# Patient Record
Sex: Female | Born: 1970 | Race: Black or African American | Hispanic: No | Marital: Single | Smoking: Former smoker
Health system: Southern US, Community
[De-identification: ages and names within clinical notes are randomized; demographics above are authoritative.]

## PROBLEM LIST (undated history)

## (undated) ENCOUNTER — Ambulatory Visit (HOSPITAL_COMMUNITY): Admission: EM | Payer: Self-pay | Source: Home / Self Care

## (undated) DIAGNOSIS — F1210 Cannabis abuse, uncomplicated: Secondary | ICD-10-CM

## (undated) DIAGNOSIS — Z87891 Personal history of nicotine dependence: Secondary | ICD-10-CM

## (undated) HISTORY — PX: FINGER GANGLION CYST EXCISION: SHX1636

## (undated) HISTORY — PX: FINGER SURGERY: SHX640

## (undated) HISTORY — PX: UTERINE FIBROID SURGERY: SHX826

---

## 1999-06-06 ENCOUNTER — Emergency Department (HOSPITAL_COMMUNITY): Admission: EM | Admit: 1999-06-06 | Discharge: 1999-06-06 | Payer: Self-pay | Admitting: Emergency Medicine

## 2003-07-03 ENCOUNTER — Other Ambulatory Visit: Admission: RE | Admit: 2003-07-03 | Discharge: 2003-07-03 | Payer: Self-pay | Admitting: Obstetrics and Gynecology

## 2004-06-24 ENCOUNTER — Inpatient Hospital Stay (HOSPITAL_COMMUNITY): Admission: AD | Admit: 2004-06-24 | Discharge: 2004-06-24 | Payer: Self-pay | Admitting: Obstetrics and Gynecology

## 2004-08-07 ENCOUNTER — Other Ambulatory Visit: Admission: RE | Admit: 2004-08-07 | Discharge: 2004-08-07 | Payer: Self-pay | Admitting: Obstetrics and Gynecology

## 2004-08-17 ENCOUNTER — Ambulatory Visit: Payer: Self-pay | Admitting: Internal Medicine

## 2004-10-15 ENCOUNTER — Ambulatory Visit: Payer: Self-pay | Admitting: Internal Medicine

## 2005-03-30 ENCOUNTER — Inpatient Hospital Stay (HOSPITAL_COMMUNITY): Admission: RE | Admit: 2005-03-30 | Discharge: 2005-04-02 | Payer: Self-pay | Admitting: Obstetrics and Gynecology

## 2006-01-07 ENCOUNTER — Ambulatory Visit: Payer: Self-pay | Admitting: Internal Medicine

## 2006-01-28 ENCOUNTER — Ambulatory Visit: Payer: Self-pay | Admitting: Internal Medicine

## 2006-02-14 ENCOUNTER — Ambulatory Visit: Payer: Self-pay | Admitting: Internal Medicine

## 2006-03-03 ENCOUNTER — Ambulatory Visit (HOSPITAL_COMMUNITY): Admission: RE | Admit: 2006-03-03 | Discharge: 2006-03-03 | Payer: Self-pay | Admitting: Internal Medicine

## 2014-04-28 ENCOUNTER — Emergency Department (HOSPITAL_COMMUNITY)
Admission: EM | Admit: 2014-04-28 | Discharge: 2014-04-29 | Disposition: A | Discharge status: Home / Self Care | Payer: Self-pay | Attending: Emergency Medicine | Admitting: Emergency Medicine

## 2014-04-28 DIAGNOSIS — S4991XA Unspecified injury of right shoulder and upper arm, initial encounter: Secondary | ICD-10-CM | POA: Insufficient documentation

## 2014-04-28 DIAGNOSIS — S3992XA Unspecified injury of lower back, initial encounter: Secondary | ICD-10-CM | POA: Insufficient documentation

## 2014-04-28 DIAGNOSIS — Y92410 Unspecified street and highway as the place of occurrence of the external cause: Secondary | ICD-10-CM | POA: Insufficient documentation

## 2014-04-28 DIAGNOSIS — Y9389 Activity, other specified: Secondary | ICD-10-CM | POA: Insufficient documentation

## 2014-04-28 DIAGNOSIS — M7918 Myalgia, other site: Secondary | ICD-10-CM

## 2014-04-28 DIAGNOSIS — S29091A Other injury of muscle and tendon of front wall of thorax, initial encounter: Secondary | ICD-10-CM | POA: Insufficient documentation

## 2014-04-28 DIAGNOSIS — S199XXA Unspecified injury of neck, initial encounter: Secondary | ICD-10-CM | POA: Insufficient documentation

## 2014-04-29 ENCOUNTER — Emergency Department (HOSPITAL_COMMUNITY): Payer: Self-pay

## 2014-04-29 ENCOUNTER — Encounter (HOSPITAL_COMMUNITY): Payer: Self-pay | Admitting: Emergency Medicine

## 2014-04-29 MED ORDER — HYDROCODONE-ACETAMINOPHEN 5-325 MG PO TABS: 1.0000 | ORAL_TABLET | ORAL | Status: DC | PRN

## 2014-04-29 MED ORDER — METHOCARBAMOL 750 MG PO TABS: 750.0000 mg | ORAL_TABLET | Freq: Four times a day (QID) | ORAL | Status: DC | PRN

## 2014-04-29 MED ORDER — HYDROCODONE-ACETAMINOPHEN 5-325 MG PO TABS
1.0000 | ORAL_TABLET | ORAL | Status: AC
  Administered 2014-04-29: 1 via ORAL
  Filled 2014-04-29: qty 1
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
## 2014-04-29 NOTE — ED Notes (Signed)
Pt's earrings were found in radiology.  Called pt at home and she states, "they are cheap, just throw them away."  Gayle from radiology also spoke with pt via telephone to confirm/witness.
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
## 2016-02-05 ENCOUNTER — Encounter (HOSPITAL_COMMUNITY): Payer: Self-pay | Admitting: *Deleted

## 2016-02-05 ENCOUNTER — Inpatient Hospital Stay (HOSPITAL_COMMUNITY)
Admission: EM | Admit: 2016-02-05 | Discharge: 2016-02-08 | DRG: 580 | Disposition: A | Discharge status: Home / Self Care | Payer: Self-pay | Attending: Family Medicine | Admitting: Family Medicine

## 2016-02-05 DIAGNOSIS — Z87891 Personal history of nicotine dependence: Secondary | ICD-10-CM

## 2016-02-05 DIAGNOSIS — L02511 Cutaneous abscess of right hand: Principal | ICD-10-CM | POA: Diagnosis present

## 2016-02-05 DIAGNOSIS — D509 Iron deficiency anemia, unspecified: Secondary | ICD-10-CM | POA: Diagnosis present

## 2016-02-05 DIAGNOSIS — L02413 Cutaneous abscess of right upper limb: Secondary | ICD-10-CM | POA: Diagnosis present

## 2016-02-05 DIAGNOSIS — R600 Localized edema: Secondary | ICD-10-CM | POA: Diagnosis present

## 2016-02-05 DIAGNOSIS — L237 Allergic contact dermatitis due to plants, except food: Secondary | ICD-10-CM | POA: Diagnosis present

## 2016-02-05 DIAGNOSIS — M7989 Other specified soft tissue disorders: Secondary | ICD-10-CM | POA: Diagnosis present

## 2016-02-05 DIAGNOSIS — F1210 Cannabis abuse, uncomplicated: Secondary | ICD-10-CM | POA: Diagnosis present

## 2016-02-05 HISTORY — DX: Personal history of nicotine dependence: Z87.891

## 2016-02-05 HISTORY — DX: Cannabis abuse, uncomplicated: F12.10

## 2016-02-05 MED ORDER — CEFAZOLIN IN D5W 1 GM/50ML IV SOLN
1.0000 g | Freq: Once | INTRAVENOUS | Status: AC
  Administered 2016-02-05: 1 g via INTRAVENOUS
  Filled 2016-02-05: qty 50

## 2016-02-05 MED ORDER — ACETAMINOPHEN 500 MG PO TABS
1000.0000 mg | ORAL_TABLET | Freq: Once | ORAL | Status: AC
  Administered 2016-02-05: 1000 mg via ORAL
  Filled 2016-02-05: qty 2

## 2016-02-05 MED ORDER — SODIUM CHLORIDE 0.9 % IV BOLUS (SEPSIS)
1000.0000 mL | Freq: Once | INTRAVENOUS | Status: AC
  Administered 2016-02-05: 1000 mL via INTRAVENOUS
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
## 2016-02-06 ENCOUNTER — Observation Stay (HOSPITAL_COMMUNITY): Payer: Self-pay | Admitting: Anesthesiology

## 2016-02-06 ENCOUNTER — Encounter (HOSPITAL_COMMUNITY)
Admission: EM | Disposition: A | Discharge status: Home / Self Care | Payer: Self-pay | Source: Home / Self Care | Attending: Family Medicine

## 2016-02-06 ENCOUNTER — Encounter (HOSPITAL_COMMUNITY): Payer: Self-pay | Admitting: Internal Medicine

## 2016-02-06 ENCOUNTER — Emergency Department (HOSPITAL_COMMUNITY): Payer: Self-pay

## 2016-02-06 DIAGNOSIS — M7989 Other specified soft tissue disorders: Secondary | ICD-10-CM

## 2016-02-06 DIAGNOSIS — R600 Localized edema: Secondary | ICD-10-CM | POA: Diagnosis present

## 2016-02-06 DIAGNOSIS — D509 Iron deficiency anemia, unspecified: Secondary | ICD-10-CM | POA: Diagnosis present

## 2016-02-06 HISTORY — PX: INCISION AND DRAINAGE ABSCESS: SHX5864

## 2016-02-06 LAB — CBC WITH DIFFERENTIAL/PLATELET
Basophils Absolute: 0 10*3/uL (ref 0.0–0.1)
Basophils Relative: 0 %
Eosinophils Absolute: 0.3 10*3/uL (ref 0.0–0.7)
Eosinophils Relative: 3 %
HCT: 27.6 % — ABNORMAL LOW (ref 36.0–46.0)
Hemoglobin: 8.5 g/dL — ABNORMAL LOW (ref 12.0–15.0)
Lymphocytes Relative: 13 %
Lymphs Abs: 1.3 10*3/uL (ref 0.7–4.0)
MCH: 22.5 pg — AB (ref 26.0–34.0)
MCHC: 30.8 g/dL (ref 30.0–36.0)
MCV: 73.2 fL — AB (ref 78.0–100.0)
Monocytes Absolute: 0.5 10*3/uL (ref 0.1–1.0)
Monocytes Relative: 5 %
NEUTROS ABS: 7.9 10*3/uL — AB (ref 1.7–7.7)
Neutrophils Relative %: 79 %
PLATELETS: 394 10*3/uL (ref 150–400)
RBC: 3.77 MIL/uL — ABNORMAL LOW (ref 3.87–5.11)
RDW: 19.4 % — ABNORMAL HIGH (ref 11.5–15.5)
WBC: 10 10*3/uL (ref 4.0–10.5)

## 2016-02-06 LAB — PROTIME-INR
INR: 1.19 (ref 0.00–1.49)
Prothrombin Time: 15.3 seconds — ABNORMAL HIGH (ref 11.6–15.2)

## 2016-02-06 LAB — BASIC METABOLIC PANEL
ANION GAP: 7 (ref 5–15)
BUN: 11 mg/dL (ref 6–20)
CALCIUM: 9.1 mg/dL (ref 8.9–10.3)
CO2: 21 mmol/L — ABNORMAL LOW (ref 22–32)
Chloride: 105 mmol/L (ref 101–111)
Creatinine, Ser: 0.74 mg/dL (ref 0.44–1.00)
GFR calc Af Amer: >60 mL/min (ref >60)
GFR calc non Af Amer: >60 mL/min (ref >60)
Glucose, Bld: 100 mg/dL — ABNORMAL HIGH (ref 65–99)
Potassium: 3.5 mmol/L (ref 3.5–5.1)
Sodium: 133 mmol/L — ABNORMAL LOW (ref 135–145)

## 2016-02-06 LAB — HCG, QUANTITATIVE, PREGNANCY: HCG, BETA CHAIN, QUANT, S: 1 m[IU]/mL (ref <5)

## 2016-02-06 LAB — TYPE AND SCREEN
ABO/RH(D): O POS
ANTIBODY SCREEN: NEGATIVE

## 2016-02-06 LAB — ABO/RH: ABO/RH(D): O POS

## 2016-02-06 LAB — GLUCOSE, CAPILLARY: Glucose-Capillary: 136 mg/dL — ABNORMAL HIGH (ref 65–99)

## 2016-02-06 LAB — RAPID URINE DRUG SCREEN, HOSP PERFORMED
AMPHETAMINES: NOT DETECTED
BARBITURATES: NOT DETECTED
BENZODIAZEPINES: NOT DETECTED
COCAINE: NOT DETECTED
Opiates: POSITIVE — AB
TETRAHYDROCANNABINOL: POSITIVE — AB

## 2016-02-06 LAB — LACTIC ACID, PLASMA: Lactic Acid, Venous: 1.4 mmol/L (ref 0.5–1.9)

## 2016-02-06 LAB — APTT: aPTT: 31 seconds (ref 24–37)

## 2016-02-06 SURGERY — INCISION AND DRAINAGE, ABSCESS
Anesthesia: General | Laterality: Right

## 2016-02-06 MED ORDER — LACTATED RINGERS IV SOLN
INTRAVENOUS | Status: DC | PRN
  Administered 2016-02-06: 07:00:00 via INTRAVENOUS

## 2016-02-06 MED ORDER — PROTAMINE SULFATE 10 MG/ML IV SOLN
INTRAVENOUS | Status: AC
  Filled 2016-02-06: qty 10

## 2016-02-06 MED ORDER — DEXAMETHASONE SODIUM PHOSPHATE 10 MG/ML IJ SOLN
10.0000 mg | Freq: Three times a day (TID) | INTRAMUSCULAR | Status: AC
  Administered 2016-02-06 (×3): 10 mg via INTRAVENOUS
  Filled 2016-02-06 (×2): qty 1

## 2016-02-06 MED ORDER — ONDANSETRON HCL 4 MG/2ML IJ SOLN: 4.0000 mg | Freq: Once | INTRAMUSCULAR | Status: DC | PRN

## 2016-02-06 MED ORDER — OXYCODONE HCL 5 MG PO TABS: 5.0000 mg | ORAL_TABLET | Freq: Once | ORAL | Status: DC | PRN

## 2016-02-06 MED ORDER — HEPARIN SODIUM (PORCINE) 1000 UNIT/ML IJ SOLN
INTRAMUSCULAR | Status: AC
  Filled 2016-02-06: qty 1

## 2016-02-06 MED ORDER — HYDROMORPHONE HCL 1 MG/ML IJ SOLN: 0.2500 mg | INTRAMUSCULAR | Status: DC | PRN

## 2016-02-06 MED ORDER — ENOXAPARIN SODIUM 40 MG/0.4ML ~~LOC~~ SOLN
40.0000 mg | SUBCUTANEOUS | Status: DC
  Administered 2016-02-07 – 2016-02-08 (×2): 40 mg via SUBCUTANEOUS
  Filled 2016-02-06 (×2): qty 0.4

## 2016-02-06 MED ORDER — SODIUM CHLORIDE 0.9 % IR SOLN
Status: DC | PRN
  Administered 2016-02-06: 3000 mL

## 2016-02-06 MED ORDER — LIDOCAINE HCL (CARDIAC) 20 MG/ML IV SOLN
INTRAVENOUS | Status: DC | PRN
  Administered 2016-02-06: 60 mg via INTRAVENOUS

## 2016-02-06 MED ORDER — BUPIVACAINE HCL (PF) 0.25 % IJ SOLN
INTRAMUSCULAR | Status: AC
  Filled 2016-02-06: qty 30

## 2016-02-06 MED ORDER — ACETAMINOPHEN 325 MG PO TABS
650.0000 mg | ORAL_TABLET | Freq: Four times a day (QID) | ORAL | Status: DC | PRN
Start: 2016-02-06 — End: 2016-02-08

## 2016-02-06 MED ORDER — OXYCODONE HCL 5 MG/5ML PO SOLN: 5.0000 mg | Freq: Once | ORAL | Status: DC | PRN

## 2016-02-06 MED ORDER — SUGAMMADEX SODIUM 200 MG/2ML IV SOLN
INTRAVENOUS | Status: AC
  Filled 2016-02-06: qty 2

## 2016-02-06 MED ORDER — ONDANSETRON HCL 4 MG/2ML IJ SOLN
4.0000 mg | Freq: Three times a day (TID) | INTRAMUSCULAR | Status: DC | PRN
  Administered 2016-02-06 (×2): 4 mg via INTRAVENOUS
  Filled 2016-02-06: qty 2

## 2016-02-06 MED ORDER — FENTANYL CITRATE (PF) 100 MCG/2ML IJ SOLN
INTRAMUSCULAR | Status: DC | PRN
  Administered 2016-02-06 (×3): 50 ug via INTRAVENOUS
  Administered 2016-02-06: 100 ug via INTRAVENOUS

## 2016-02-06 MED ORDER — SODIUM CHLORIDE 0.9 % IV SOLN
INTRAVENOUS | Status: DC
  Administered 2016-02-06 (×2): via INTRAVENOUS

## 2016-02-06 MED ORDER — MORPHINE SULFATE (PF) 2 MG/ML IV SOLN: 1.0000 mg | INTRAVENOUS | Status: DC | PRN

## 2016-02-06 MED ORDER — PROPOFOL 10 MG/ML IV BOLUS
INTRAVENOUS | Status: DC | PRN
  Administered 2016-02-06: 170 mg via INTRAVENOUS

## 2016-02-06 MED ORDER — METHOCARBAMOL 750 MG PO TABS: 750.0000 mg | ORAL_TABLET | Freq: Four times a day (QID) | ORAL | Status: DC | PRN

## 2016-02-06 MED ORDER — DIPHENHYDRAMINE HCL 25 MG PO CAPS
25.0000 mg | ORAL_CAPSULE | Freq: Four times a day (QID) | ORAL | Status: DC | PRN
  Administered 2016-02-07 (×2): 25 mg via ORAL
  Filled 2016-02-06 (×2): qty 1

## 2016-02-06 MED ORDER — BUPIVACAINE HCL (PF) 0.25 % IJ SOLN
INTRAMUSCULAR | Status: DC | PRN
  Administered 2016-02-06: 30 mL

## 2016-02-06 MED ORDER — DEXAMETHASONE SODIUM PHOSPHATE 10 MG/ML IJ SOLN
10.0000 mg | Freq: Once | INTRAMUSCULAR | Status: AC
  Administered 2016-02-06: 10 mg via INTRAVENOUS
  Filled 2016-02-06: qty 1

## 2016-02-06 MED ORDER — HYDROCODONE-ACETAMINOPHEN 5-325 MG PO TABS
2.0000 | ORAL_TABLET | ORAL | Status: DC | PRN
  Administered 2016-02-06 – 2016-02-08 (×7): 2 via ORAL
  Filled 2016-02-06 (×7): qty 2

## 2016-02-06 MED ORDER — FENTANYL CITRATE (PF) 250 MCG/5ML IJ SOLN
INTRAMUSCULAR | Status: AC
  Filled 2016-02-06: qty 5

## 2016-02-06 MED ORDER — TEMAZEPAM 15 MG PO CAPS: 15.0000 mg | ORAL_CAPSULE | Freq: Every evening | ORAL | Status: DC | PRN

## 2016-02-06 MED ORDER — MIDAZOLAM HCL 2 MG/2ML IJ SOLN
INTRAMUSCULAR | Status: AC
  Filled 2016-02-06: qty 2

## 2016-02-06 MED ORDER — ROCURONIUM BROMIDE 50 MG/5ML IV SOLN
INTRAVENOUS | Status: AC
  Filled 2016-02-06: qty 1

## 2016-02-06 MED ORDER — ONDANSETRON HCL 4 MG/2ML IJ SOLN
INTRAMUSCULAR | Status: AC
  Filled 2016-02-06: qty 2

## 2016-02-06 MED ORDER — LIDOCAINE 2% (20 MG/ML) 5 ML SYRINGE
INTRAMUSCULAR | Status: AC
  Filled 2016-02-06: qty 5

## 2016-02-06 MED ORDER — VANCOMYCIN HCL IN DEXTROSE 1-5 GM/200ML-% IV SOLN
1000.0000 mg | Freq: Once | INTRAVENOUS | Status: AC
  Administered 2016-02-06: 1000 mg via INTRAVENOUS
  Filled 2016-02-06: qty 200

## 2016-02-06 MED ORDER — LACTATED RINGERS IV SOLN: INTRAVENOUS | Status: DC

## 2016-02-06 MED ORDER — ACETAMINOPHEN 650 MG RE SUPP: 650.0000 mg | Freq: Four times a day (QID) | RECTAL | Status: DC | PRN

## 2016-02-06 MED ORDER — OXYCODONE-ACETAMINOPHEN 5-325 MG PO TABS: 1.0000 | ORAL_TABLET | ORAL | Status: DC | PRN

## 2016-02-06 MED ORDER — MIDAZOLAM HCL 5 MG/5ML IJ SOLN
INTRAMUSCULAR | Status: DC | PRN
  Administered 2016-02-06: 2 mg via INTRAVENOUS

## 2016-02-06 MED ORDER — VITAMIN C 500 MG PO TABS
1000.0000 mg | ORAL_TABLET | Freq: Every day | ORAL | Status: DC
  Administered 2016-02-06 – 2016-02-08 (×3): 1000 mg via ORAL
  Filled 2016-02-06 (×3): qty 2

## 2016-02-06 MED ORDER — DIPHENHYDRAMINE HCL 25 MG PO CAPS
25.0000 mg | ORAL_CAPSULE | Freq: Two times a day (BID) | ORAL | Status: DC
  Administered 2016-02-06 – 2016-02-08 (×6): 25 mg via ORAL
  Filled 2016-02-06 (×7): qty 1

## 2016-02-06 MED ORDER — VANCOMYCIN HCL IN DEXTROSE 1-5 GM/200ML-% IV SOLN
1000.0000 mg | Freq: Two times a day (BID) | INTRAVENOUS | Status: DC
  Administered 2016-02-06 – 2016-02-08 (×4): 1000 mg via INTRAVENOUS
  Filled 2016-02-06 (×6): qty 200

## 2016-02-06 MED ORDER — PROPOFOL 10 MG/ML IV BOLUS
INTRAVENOUS | Status: AC
  Filled 2016-02-06: qty 20

## 2016-02-06 SURGICAL SUPPLY — 62 items
BANDAGE ACE 3X5.8 VEL STRL LF (GAUZE/BANDAGES/DRESSINGS) ×2 IMPLANT
BANDAGE COBAN STERILE 2 (GAUZE/BANDAGES/DRESSINGS) IMPLANT
BANDAGE ELASTIC 3 VELCRO ST LF (GAUZE/BANDAGES/DRESSINGS) ×3 IMPLANT
BANDAGE ELASTIC 4 VELCRO ST LF (GAUZE/BANDAGES/DRESSINGS) ×3 IMPLANT
BNDG CMPR 9X4 STRL LF SNTH (GAUZE/BANDAGES/DRESSINGS) ×1
BNDG COHESIVE 1X5 TAN STRL LF (GAUZE/BANDAGES/DRESSINGS) IMPLANT
BNDG CONFORM 2 STRL LF (GAUZE/BANDAGES/DRESSINGS) IMPLANT
BNDG ESMARK 4X9 LF (GAUZE/BANDAGES/DRESSINGS) ×2 IMPLANT
BNDG GAUZE ELAST 4 BULKY (GAUZE/BANDAGES/DRESSINGS) ×3 IMPLANT
CORDS BIPOLAR (ELECTRODE) ×3 IMPLANT
COVER SURGICAL LIGHT HANDLE (MISCELLANEOUS) ×3 IMPLANT
CUFF TOURNIQUET SINGLE 18IN (TOURNIQUET CUFF) ×2 IMPLANT
DECANTER SPIKE VIAL GLASS SM (MISCELLANEOUS) ×3 IMPLANT
DRAIN PENROSE 1/4X12 LTX STRL (WOUND CARE) IMPLANT
DRSG ADAPTIC 3X8 NADH LF (GAUZE/BANDAGES/DRESSINGS) IMPLANT
DRSG EMULSION OIL 3X3 NADH (GAUZE/BANDAGES/DRESSINGS) ×3 IMPLANT
DRSG PAD ABDOMINAL 8X10 ST (GAUZE/BANDAGES/DRESSINGS) ×6 IMPLANT
FACESHIELD WRAPAROUND (MASK) ×3 IMPLANT
FACESHIELD WRAPAROUND OR TEAM (MASK) IMPLANT
GAUZE IODOFORM PACK 1/2 7832 (GAUZE/BANDAGES/DRESSINGS) ×2 IMPLANT
GAUZE SPONGE 4X4 12PLY STRL (GAUZE/BANDAGES/DRESSINGS) ×3 IMPLANT
GAUZE XEROFORM 1X8 LF (GAUZE/BANDAGES/DRESSINGS) ×3 IMPLANT
GLOVE BIO SURGEON STRL SZ7.5 (GLOVE) ×3 IMPLANT
GLOVE BIOGEL PI IND STRL 6.5 (GLOVE) IMPLANT
GLOVE BIOGEL PI IND STRL 8 (GLOVE) ×1 IMPLANT
GLOVE BIOGEL PI INDICATOR 6.5 (GLOVE) ×2
GLOVE BIOGEL PI INDICATOR 8 (GLOVE) ×2
GLOVE ECLIPSE 6.5 STRL STRAW (GLOVE) ×2 IMPLANT
GOWN STRL REUS W/ TWL LRG LVL3 (GOWN DISPOSABLE) ×1 IMPLANT
GOWN STRL REUS W/TWL LRG LVL3 (GOWN DISPOSABLE) ×3
KIT BASIN OR (CUSTOM PROCEDURE TRAY) ×3 IMPLANT
KIT ROOM TURNOVER OR (KITS) ×3 IMPLANT
LOOP VESSEL MAXI BLUE (MISCELLANEOUS) IMPLANT
MANIFOLD NEPTUNE II (INSTRUMENTS) ×3 IMPLANT
NDL HYPO 25X1 1.5 SAFETY (NEEDLE) IMPLANT
NEEDLE HYPO 25X1 1.5 SAFETY (NEEDLE) ×3 IMPLANT
NS IRRIG 1000ML POUR BTL (IV SOLUTION) ×3 IMPLANT
PACK ORTHO EXTREMITY (CUSTOM PROCEDURE TRAY) ×3 IMPLANT
PAD ABD 8X10 STRL (GAUZE/BANDAGES/DRESSINGS) ×2 IMPLANT
PAD ARMBOARD 7.5X6 YLW CONV (MISCELLANEOUS) ×6 IMPLANT
PAD CAST 4YDX4 CTTN HI CHSV (CAST SUPPLIES) IMPLANT
PADDING CAST COTTON 4X4 STRL (CAST SUPPLIES) ×3
SCRUB BETADINE 4OZ XXX (MISCELLANEOUS) ×3 IMPLANT
SET CYSTO W/LG BORE CLAMP LF (SET/KITS/TRAYS/PACK) ×3 IMPLANT
SOLUTION BETADINE 4OZ (MISCELLANEOUS) ×3 IMPLANT
SPONGE GAUZE 4X4 12PLY STER LF (GAUZE/BANDAGES/DRESSINGS) ×2 IMPLANT
SPONGE LAP 4X18 X RAY DECT (DISPOSABLE) ×3 IMPLANT
SUT ETHILON 4 0 P 3 18 (SUTURE) IMPLANT
SUT ETHILON 4 0 PS 2 18 (SUTURE) ×3 IMPLANT
SUT MON AB 5-0 P3 18 (SUTURE) IMPLANT
SWAB COLLECTION DEVICE MRSA (MISCELLANEOUS) ×2 IMPLANT
SWAB CULTURE ESWAB REG 1ML (MISCELLANEOUS) ×2 IMPLANT
SYR CONTROL 10ML LL (SYRINGE) IMPLANT
TOWEL OR 17X24 6PK STRL BLUE (TOWEL DISPOSABLE) ×3 IMPLANT
TOWEL OR 17X26 10 PK STRL BLUE (TOWEL DISPOSABLE) ×3 IMPLANT
TUBE ANAEROBIC SPECIMEN COL (MISCELLANEOUS) IMPLANT
TUBE CONNECTING 12'X1/4 (SUCTIONS) ×1
TUBE CONNECTING 12X1/4 (SUCTIONS) ×2 IMPLANT
TUBE FEEDING 5FR 15 INCH (TUBING) IMPLANT
UNDERPAD 30X30 INCONTINENT (UNDERPADS AND DIAPERS) ×3 IMPLANT
WATER STERILE IRR 1000ML POUR (IV SOLUTION) ×3 IMPLANT
YANKAUER SUCT BULB TIP NO VENT (SUCTIONS) ×3 IMPLANT

## 2016-02-06 NOTE — ED Notes (Signed)
Patient ambulated independently to the restroom.

## 2016-02-06 NOTE — Op Note (Signed)
[id number] 

## 2016-02-06 NOTE — Anesthesia Procedure Notes (Signed)
Procedure Name: LMA Insertion Date/Time: 02/06/2016 7:48 AM Performed by: Orlinda BlalockMCMILLEN, Teddy Pena L Pre-anesthesia Checklist: Patient identified, Emergency Drugs available, Suction available and Patient being monitored Patient Re-evaluated:Patient Re-evaluated prior to inductionOxygen Delivery Method: Circle System Utilized Preoxygenation: Pre-oxygenation with 100% oxygen Intubation Type: IV induction Ventilation: Mask ventilation without difficulty LMA: LMA inserted LMA Size: 4.0 Number of attempts: 1 Placement Confirmation: positive ETCO2 Tube secured with: Tape Dental Injury: Teeth and Oropharynx as per pre-operative assessment

## 2016-02-06 NOTE — Anesthesia Preprocedure Evaluation (Addendum)
Anesthesia Evaluation  Patient identified by MRN, date of birth, ID band Patient awake    Reviewed: Allergy & Precautions, NPO status , Patient's Chart, lab work & pertinent test results  Airway Mallampati: II  TM Distance: >3 FB Neck ROM: Full    Dental  (+) Teeth Intact, Dental Advisory Given   Pulmonary former smoker,    breath sounds clear to auscultation       Cardiovascular  Rhythm:Regular Rate:Normal     Neuro/Psych    GI/Hepatic   Endo/Other    Renal/GU      Musculoskeletal   Abdominal   Peds  Hematology   Anesthesia Other Findings   Reproductive/Obstetrics                            Anesthesia Physical Anesthesia Plan  ASA: III and emergent  Anesthesia Plan: General   Post-op Pain Management:    Induction: Intravenous  Airway Management Planned: Oral ETT  Additional Equipment:   Intra-op Plan:   Post-operative Plan:   Informed Consent: I have reviewed the patients History and Physical, chart, labs and discussed the procedure including the risks, benefits and alternatives for the proposed anesthesia with the patient or authorized representative who has indicated his/her understanding and acceptance.   Dental advisory given  Plan Discussed with: CRNA and Anesthesiologist  Anesthesia Plan Comments:         Anesthesia Quick Evaluation

## 2016-02-06 NOTE — Progress Notes (Signed)
Pharmacy Antibiotic Note  Hayley Carr is a 45 y.o. female admitted on 02/05/2016 with suspected R hand and arm infection.  Pharmacy has been consulted for Vancomycin dosing. Pt receiving Vancomycin 1gm in ED ~0150 and Ancef 1gm ~2330.  Plan: Vancomycin 1gm IV q12h Will f/u micro data, renal function, and pt's clinical condition Vanc trough prn  Height: 5\' 9"  (175.3 cm) Weight: 165 lb (74.844 kg) IBW/kg (Calculated) : 66.2  Temp (24hrs), Avg:98.2 F (36.8 C), Min:98.1 F (36.7 C), Max:98.3 F (36.8 C)   Recent Labs Lab 02/05/16 2340  WBC 10.0  CREATININE 0.74    Estimated Creatinine Clearance: 92.8 mL/min (by C-G formula based on Cr of 0.74).    Allergies  Allergen Reactions  . Ibuprofen Nausea Only    Antimicrobials this admission: 7/13 Ancef x 1 7/14 Vanc >>   Dose adjustments this admission: n/a  Thank you for allowing pharmacy to be a part of this patient's care.  Christoper Fabianaron Elianah Karis, PharmD, BCPS Clinical pharmacist, pager [phone] 02/06/2016 2:17 AM

## 2016-02-06 NOTE — Brief Op Note (Signed)
02/05/2016 - 02/06/2016  8:26 AM  PATIENT:  Hayley Carr  45 y.o. female  PRE-OPERATIVE DIAGNOSIS:  RIGHT ARM ABSCESS  POST-OPERATIVE DIAGNOSIS:  RIGHT ARM ABSCESS  PROCEDURE:  Procedure(s): INCISION AND DRAINAGE ABSCESS (Right)  SURGEON:  Surgeon(s) and Role:    * Betha LoaKevin Bejamin Hackbart, MD - Primary  PHYSICIAN ASSISTANT:   ASSISTANTS: none   ANESTHESIA:   general  EBL:  Total I/O In: 600 [I.V.:600] Out: -   BLOOD ADMINISTERED:none  DRAINS: iodoform packing  LOCAL MEDICATIONS USED:  MARCAINE     SPECIMEN:  Source of Specimen:  right hand  DISPOSITION OF SPECIMEN:  micro  COUNTS:  YES  TOURNIQUET:   Total Tourniquet Time Documented: Upper Arm (Right) - 24 minutes Total: Upper Arm (Right) - 24 minutes   DICTATION: .Other Dictation: Dictation Number [id number]  PLAN OF CARE: return to floor  PATIENT DISPOSITION:  PACU - hemodynamically stable.   Delay start of Pharmacological VTE agent (>24hrs) due to surgical blood loss or risk of bleeding: no

## 2016-02-06 NOTE — H&P (Signed)
Hayley Carr is an 45 y.o. female.   Chief Complaint: right hand/arm pain and swelling HPI: 45 yo rhd female states she has had progressively worsening right hand and arm pain and swelling over past 2 days.  She thinks it may have started from her helping take care of her boyfriend's poison oak.  She notes also increased warmth in the arm.  No fevers, chills, night sweats.  States she has not injected anything.  Case discussed with Monico Blitz, PA-C and her note from 02/06/2016 reviewed. Xrays viewed and interpreted by me: ap and lateral views of wrist and forearm show no fracture, dislocation, radioopaque foreign body.  Significant dorsal soft tissue swelling without free air. Labs reviewed: WBC 10.0  Allergies:  Allergies  Allergen Reactions  . Ibuprofen Nausea Only    Past Medical History  Diagnosis Date  . Former smoker   . Marijuana abuse     Past Surgical History  Procedure Laterality Date  . Uterine fibroid surgery    . Finger surgery    . Finger ganglion cyst excision      Family History: Family History  Problem Relation Age of Onset  . Colon cancer Father     Social History:   reports that she has quit smoking. She has never used smokeless tobacco. She reports that she uses illicit drugs (Marijuana). She reports that she does not drink alcohol.  Medications: Medications Prior to Admission  Medication Sig Dispense Refill  . HYDROcodone-acetaminophen (NORCO/VICODIN) 5-325 MG per tablet Take 1-2 tablets by mouth every 4 (four) hours as needed for moderate pain or severe pain. (Patient not taking: Reported on 02/06/2016) 20 tablet 0  . methocarbamol (ROBAXIN-750) 750 MG tablet Take 1 tablet (750 mg total) by mouth every 6 (six) hours as needed for muscle spasms. (Patient not taking: Reported on 02/06/2016) 40 tablet 0    Results for orders placed or performed during the hospital encounter of 02/05/16 (from the past 48 hour(s))  CBC with Differential     Status:  Abnormal   Collection Time: 02/05/16 11:40 PM  Result Value Ref Range   WBC 10.0 4.0 - 10.5 K/uL   RBC 3.77 (L) 3.87 - 5.11 MIL/uL   Hemoglobin 8.5 (L) 12.0 - 15.0 g/dL   HCT 27.6 (L) 36.0 - 46.0 %   MCV 73.2 (L) 78.0 - 100.0 fL   MCH 22.5 (L) 26.0 - 34.0 pg   MCHC 30.8 30.0 - 36.0 g/dL   RDW 19.4 (H) 11.5 - 15.5 %   Platelets 394 150 - 400 K/uL   Neutrophils Relative % 79 %   Lymphocytes Relative 13 %   Monocytes Relative 5 %   Eosinophils Relative 3 %   Basophils Relative 0 %   Neutro Abs 7.9 (H) 1.7 - 7.7 K/uL   Lymphs Abs 1.3 0.7 - 4.0 K/uL   Monocytes Absolute 0.5 0.1 - 1.0 K/uL   Eosinophils Absolute 0.3 0.0 - 0.7 K/uL   Basophils Absolute 0.0 0.0 - 0.1 K/uL   Smear Review LARGE PLATELETS PRESENT   Basic metabolic panel     Status: Abnormal   Collection Time: 02/05/16 11:40 PM  Result Value Ref Range   Sodium 133 (L) 135 - 145 mmol/L   Potassium 3.5 3.5 - 5.1 mmol/L   Chloride 105 101 - 111 mmol/L   CO2 21 (L) 22 - 32 mmol/L   Glucose, Bld 100 (H) 65 - 99 mg/dL   BUN 11 6 - 20 mg/dL  Creatinine, Ser 0.74 0.44 - 1.00 mg/dL   Calcium 9.1 8.9 - 10.3 mg/dL   GFR calc non Af Amer >60 >60 mL/min   GFR calc Af Amer >60 >60 mL/min    Comment: (NOTE) The eGFR has been calculated using the CKD EPI equation. This calculation has not been validated in all clinical situations. eGFR's persistently <60 mL/min signify possible Chronic Kidney Disease.    Anion gap 7 5 - 15  Type and screen [place]     Status: None   Collection Time: 02/06/16  2:51 AM  Result Value Ref Range   ABO/RH(D) O POS    Antibody Screen NEG    Sample Expiration 02/09/2016   Lactic acid, plasma     Status: None   Collection Time: 02/06/16  2:51 AM  Result Value Ref Range   Lactic Acid, Venous 1.4 0.5 - 1.9 mmol/L  Protime-INR     Status: Abnormal   Collection Time: 02/06/16  2:51 AM  Result Value Ref Range   Prothrombin Time 15.3 (H) 11.6 - 15.2 seconds   INR 1.19 0.00 - 1.49   APTT     Status: None   Collection Time: 02/06/16  2:51 AM  Result Value Ref Range   aPTT 31 24 - 37 seconds  ABO/Rh     Status: None (Preliminary result)   Collection Time: 02/06/16  2:51 AM  Result Value Ref Range   ABO/RH(D) O POS   hCG, quantitative, pregnancy     Status: None   Collection Time: 02/06/16  2:54 AM  Result Value Ref Range   hCG, Beta Chain, Quant, S 1 <5 mIU/mL    Comment:          GEST. AGE      CONC.  (mIU/mL)   <=1 WEEK        5 - 50     2 WEEKS       50 - 500     3 WEEKS       100 - 10,000     4 WEEKS     1,000 - 30,000     5 WEEKS     3,500 - 115,000   6-8 WEEKS     12,000 - 270,000    12 WEEKS     15,000 - 220,000        FEMALE AND NON-PREGNANT FEMALE:     LESS THAN 5 mIU/mL   Urine rapid drug screen (hosp performed)     Status: Abnormal   Collection Time: 02/06/16  5:06 AM  Result Value Ref Range   Opiates POSITIVE (A) NONE DETECTED   Cocaine NONE DETECTED NONE DETECTED   Benzodiazepines NONE DETECTED NONE DETECTED   Amphetamines NONE DETECTED NONE DETECTED   Tetrahydrocannabinol POSITIVE (A) NONE DETECTED   Barbiturates NONE DETECTED NONE DETECTED    Comment:        DRUG SCREEN FOR MEDICAL PURPOSES ONLY.  IF CONFIRMATION IS NEEDED FOR ANY PURPOSE, NOTIFY LAB WITHIN 5 DAYS.        LOWEST DETECTABLE LIMITS FOR URINE DRUG SCREEN Drug Class       Cutoff (ng/mL) Amphetamine      1000 Barbiturate      200 Benzodiazepine   151 Tricyclics       761 Opiates          300 Cocaine          300 THC  50   Glucose, capillary     Status: Abnormal   Collection Time: 02/06/16  6:28 AM  Result Value Ref Range   Glucose-Capillary 136 (H) 65 - 99 mg/dL    Dg Forearm Right  02/06/2016  CLINICAL DATA:  45 year old female with pain and swelling of the right upper extremity EXAM: RIGHT FOREARM - 2 VIEW; RIGHT HAND - COMPLETE 3+ VIEW COMPARISON:  None. FINDINGS: There is no acute fracture or dislocation. The bones are well mineralized. No  arthritic changes. There is diffuse soft tissue swelling of the dorsum of the hand and forearm with subcutaneous edema. No radiopaque foreign object or soft tissue gas. IMPRESSION: No acute osseous pathology. Diffuse soft tissue swelling of the dorsal aspect of the forearm and hand may represent cellulitis. Clinical correlation is recommended. Electronically Signed   By: Anner Crete M.D.   On: 02/06/2016 01:57   Dg Hand Complete Right  02/06/2016  CLINICAL DATA:  45 year old female with pain and swelling of the right upper extremity EXAM: RIGHT FOREARM - 2 VIEW; RIGHT HAND - COMPLETE 3+ VIEW COMPARISON:  None. FINDINGS: There is no acute fracture or dislocation. The bones are well mineralized. No arthritic changes. There is diffuse soft tissue swelling of the dorsum of the hand and forearm with subcutaneous edema. No radiopaque foreign object or soft tissue gas. IMPRESSION: No acute osseous pathology. Diffuse soft tissue swelling of the dorsal aspect of the forearm and hand may represent cellulitis. Clinical correlation is recommended. Electronically Signed   By: Anner Crete M.D.   On: 02/06/2016 01:57     A comprehensive review of systems was negative. Review of Systems: No fevers, chills, night sweats, chest pain, shortness of breath, nausea, vomiting, diarrhea, constipation, easy bleeding or bruising, headaches, dizziness, vision changes, fainting.   Blood pressure 142/77, pulse 77, temperature 97.8 F (36.6 C), temperature source Oral, resp. rate 17, height 5' 9"  (1.753 m), weight 89.858 kg (198 lb 1.6 oz), last menstrual period 01/29/2016, SpO2 100 %.  General appearance: alert, cooperative and appears stated age Head: Normocephalic, without obvious abnormality, atraumatic Neck: supple, symmetrical, trachea midline Resp: clear to auscultation bilaterally Cardio: regular rate and rhythm GI: non-tender Extremities: Intact sensation and capillary refill all digits.  +epl/fpl/io.   Right forearm and hand swollen and tender to palpation.  Erythema and increased warmth dorsally.  No tenderness or erythema volarly.  Able to move digits and wrist without significant joint pain.  No proximal streaking.  Blisters at dorsoulnar side of wrist.  With seepage.  Pulses: 2+ and symmetric Skin: Skin color, texture, turgor normal. No rashes or lesions Neurologic: Grossly normal Incision/Wound: Blisters as above.  Assessment/Plan Right hand/arm infection vs poison oak reaction or possibly infection over top of poison oak.  Swelling and erythema concerning for infection (such as strep cellulitis).  Recommend incision and drainage in OR.  Patient comfortable with this decision.  Risks, benefits, and alternatives of surgery were discussed and the patient agrees with the plan of care.   Georgio Hattabaugh R 02/06/2016, 7:11 AM

## 2016-02-06 NOTE — ED Notes (Signed)
Attempted report x1. 

## 2016-02-06 NOTE — ED Notes (Signed)
Patient is stable at this time.  A&Ox4 denies any numbness to the right upper extremity.  Will transport to floor after benadryl is given

## 2016-02-06 NOTE — Anesthesia Postprocedure Evaluation (Signed)
Anesthesia Post Note  Patient: Hayley Carr  Procedure(s) Performed: Procedure(s) (LRB): INCISION AND DRAINAGE ABSCESS (Right)  Patient location during evaluation: PACU Anesthesia Type: General Level of consciousness: awake, awake and alert and oriented Pain management: pain level controlled Vital Signs Assessment: post-procedure vital signs reviewed and stable Respiratory status: spontaneous breathing, nonlabored ventilation and respiratory function stable Cardiovascular status: blood pressure returned to baseline Anesthetic complications: no    Last Vitals:  Filed Vitals:   02/06/16 0900 02/06/16 0932  BP: 145/79 181/83  Pulse: 78 78  Temp: 36.7 C 36.6 C  Resp: 13 16    Last Pain:  Filed Vitals:   02/06/16 0945  PainSc: 0-No pain                 Arbie Blankley COKER

## 2016-02-06 NOTE — ED Notes (Signed)
Pt has all belongings with her in the bed.

## 2016-02-06 NOTE — Progress Notes (Addendum)
Subjective: Patient admitted this morning, see detailed H&P by Dr Clyde LundborgNiu. 45 y.o. female with medical history significant of marijuana abuse, formal smoker, who presents with right arm and hand swelling and pain. Pt states that pt states that her boyfriend has poison oak and she has been helping him with treatment. This morning, her right hand and arm started swelling, and becoming red and painful. She also has a few blisters in right hand and forearm. Her pain is constant, 8 out of 10 in severity, nonradiating. Patient does not have fever or chills. No nausea, vomiting. Denies chest pain, shortness of breath, cough, symptoms of UTI or unilateral weakness. Filed Vitals:   02/06/16 0932 02/06/16 1044  BP: 181/83 161/94  Pulse: 78 114  Temp: 97.8 F (36.6 C) 97.9 F (36.6 C)  Resp: 16 18    Chest: Clear Bilaterally Heart : S1S2 RRR Abdomen: Soft, nontender Ext : No edema Neuro: Alert, oriented x 3  A/P Right hand abscess- s/p incision and drainage  Continue vancomycin  Meredeth IdeGagan S Lama [facility] [phone]

## 2016-02-06 NOTE — H&P (Signed)
History and Physical    Hayley Carr WJX:[id number] DOB: 14-Jun-1971 DOA: 02/05/2016  Referring MD/NP/PA:   PCP: No primary care provider on file.   Patient coming from:  The patient is coming from home.  At baseline, pt is independent for most of ADL.       Chief Complaint: right arm and hand swelling and pain.   HPI: Hayley Carr is a 45 y.o. female with medical history significant of marijuana abuse, formal smoker, who presents with right arm and hand swelling and pain.  Pt states that pt states that her boyfriend has poison oak and she has been helping him with treatment. This morning, her right hand and arm started swelling, and becoming red and painful. She also has a few blisters in right hand and forearm. Her pain is constant, 8 out of 10 in severity, nonradiating. Patient does not have fever or chills. No nausea, vomiting. Denies chest pain, shortness of breath, cough, symptoms of UTI or unilateral weakness.  ED Course: pt was found to have WBC 10.0, hemoglobin 8.5 (no previous hemoglobin available), lactic acid 1.4, temperature normal, no tachycardia, electrolytes renal function okay. Patient is placed on MedSurg bed for observation. Hand surgeon, Dr. Merlyn Lot was consulted by EDP.  Review of Systems:   General: no fevers, chills, no changes in body weight, has fatigue HEENT: no blurry vision, hearing changes or sore throat Pulm: no dyspnea, coughing, wheezing CV: no chest pain, no palpitations Abd: no nausea, vomiting, abdominal pain, diarrhea, constipation GU: no dysuria, burning on urination, increased urinary frequency, hematuria  Ext: no leg edema. Has swelling, tenderness, redness and blisters in right hand and forearm.  Neuro: no unilateral weakness, numbness, or tingling, no vision change or hearing loss Skin: no rash MSK: No muscle spasm, no deformity, no limitation of range of movement in spin Heme: No easy bruising.  Travel history: No recent long distant  travel.  Allergy:  Allergies  Allergen Reactions  . Ibuprofen Nausea Only    Past Medical History  Diagnosis Date  . Former smoker   . Marijuana abuse     Past Surgical History  Procedure Laterality Date  . Uterine fibroid surgery    . Finger surgery    . Finger ganglion cyst excision      Social History:  reports that she has quit smoking. She has never used smokeless tobacco. She reports that she uses illicit drugs (Marijuana). She reports that she does not drink alcohol.  Family History:  Family History  Problem Relation Age of Onset  . Colon cancer Father      Prior to Admission medications   Medication Sig Start Date End Date Taking? Authorizing Provider  HYDROcodone-acetaminophen (NORCO/VICODIN) 5-325 MG per tablet Take 1-2 tablets by mouth every 4 (four) hours as needed for moderate pain or severe pain. 04/29/14   Marisa Severin, MD  methocarbamol (ROBAXIN-750) 750 MG tablet Take 1 tablet (750 mg total) by mouth every 6 (six) hours as needed for muscle spasms. 04/29/14   Marisa Severin, MD    Physical Exam: Filed Vitals:   02/06/16 0205 02/06/16 0206 02/06/16 0249 02/06/16 0628  BP:  157/84 179/85 142/77  Pulse:  70 72 77  Temp:  98.3 F (36.8 C) 97.8 F (36.6 C) 97.8 F (36.6 C)  TempSrc:  Oral Oral Oral  Resp:  Height:   (1.753 m)    Weight: 74.844 kg (165 lb) 74.844 kg (165 lb) 89.858 kg (198  lb 1.6 oz)   SpO2:  100% 100% 100%   General: Not in acute distress HEENT:       Eyes: PERRL, EOMI, no scleral icterus.       ENT: No discharge from the ears and nose, no pharynx injection, no tonsillar enlargement.        Neck: No JVD, no bruit, no mass felt. Heme: No neck lymph node enlargement. Cardiac: S1/S2, RRR, No murmurs, No gallops or rubs. Pulm: Good air movement bilaterally. No rales, wheezing, rhonchi or rubs. Abd: Soft, nondistended, nontender, no rebound pain, no organomegaly, BS present. GU: No hematuria Ext: No pitting leg edema  bilaterally. 2+DP/PT pulse bilaterally. Has swelling, tenderness, redness and blisters in right hand and forearm.  Musculoskeletal: No joint deformities, No joint redness or warmth, no limitation of ROM in spin. Skin: No rashes.  Neuro: Alert, oriented X3, cranial nerves II-XII grossly intact, moves all extremities normally.  Psych: Patient is not psychotic, no suicidal or hemocidal ideation.  Labs on Admission: I have personally reviewed following labs and imaging studies  CBC:  Recent Labs Lab 02/05/16 2340  WBC 10.0  NEUTROABS 7.9*  HGB 8.5*  HCT 27.6*  MCV 73.2*  PLT 394   Basic Metabolic Panel:  Recent Labs Lab 02/05/16 2340  NA 133*  K 3.5  CL 105  CO2 21*  GLUCOSE 100*  BUN 11  CREATININE 0.74  CALCIUM 9.1   GFR: Estimated Creatinine Clearance: 106.1 mL/min (by C-G formula based on Cr of 0.74). Liver Function Tests: No results for input(s): AST, ALT, ALKPHOS, BILITOT, PROT, ALBUMIN in the last 168 hours. No results for input(s): LIPASE, AMYLASE in the last 168 hours. No results for input(s): AMMONIA in the last 168 hours. Coagulation Profile:  Recent Labs Lab 02/06/16 0251  INR 1.19   Cardiac Enzymes: No results for input(s): CKTOTAL, CKMB, CKMBINDEX, TROPONINI in the last 168 hours. BNP (last 3 results) No results for input(s): PROBNP in the last 8760 hours. HbA1C: No results for input(s): HGBA1C in the last 72 hours. CBG: No results for input(s): GLUCAP in the last 168 hours. Lipid Profile: No results for input(s): CHOL, HDL, LDLCALC, TRIG, CHOLHDL, LDLDIRECT in the last 72 hours. Thyroid Function Tests: No results for input(s): TSH, T4TOTAL, FREET4, T3FREE, THYROIDAB in the last 72 hours. Anemia Panel: No results for input(s): VITAMINB12, FOLATE, FERRITIN, TIBC, IRON, RETICCTPCT in the last 72 hours. Urine analysis: No results found for: COLORURINE, APPEARANCEUR, LABSPEC, PHURINE, GLUCOSEU, HGBUR, BILIRUBINUR, KETONESUR, PROTEINUR, UROBILINOGEN,  NITRITE, LEUKOCYTESUR Sepsis Labs: @LABRCNTIP (procalcitonin:4,lacticidven:4) )No results found for this or any previous visit (from the past 240 hour(s)).   Radiological Exams on Admission: Dg Forearm Right  02/06/2016  CLINICAL DATA:  45 year old female with pain and swelling of the right upper extremity EXAM: RIGHT FOREARM - 2 VIEW; RIGHT HAND - COMPLETE 3+ VIEW COMPARISON:  None. FINDINGS: There is no acute fracture or dislocation. The bones are well mineralized. No arthritic changes. There is diffuse soft tissue swelling of the dorsum of the hand and forearm with subcutaneous edema. No radiopaque foreign object or soft tissue gas. IMPRESSION: No acute osseous pathology. Diffuse soft tissue swelling of the dorsal aspect of the forearm and hand may represent cellulitis. Clinical correlation is recommended. Electronically Signed   By: Elgie CollardArash  Radparvar M.D.   On: 02/06/2016 01:57   Dg Hand Complete Right  02/06/2016  CLINICAL DATA:  45 year old female with pain and swelling of the right upper extremity EXAM: RIGHT FOREARM - 2 VIEW;  RIGHT HAND - COMPLETE 3+ VIEW COMPARISON:  None. FINDINGS: There is no acute fracture or dislocation. The bones are well mineralized. No arthritic changes. There is diffuse soft tissue swelling of the dorsum of the hand and forearm with subcutaneous edema. No radiopaque foreign object or soft tissue gas. IMPRESSION: No acute osseous pathology. Diffuse soft tissue swelling of the dorsal aspect of the forearm and hand may represent cellulitis. Clinical correlation is recommended. Electronically Signed   By: Elgie Collard M.D.   On: 02/06/2016 01:57     EKG: Not done in ED.  Assessment/Plan Principal Problem:   Swelling of right hand Active Problems:   Microcytic anemia   Swelling of right hand and forearm: Is most likely due to poison oak, but cannot completely rule out infection. Hand surgeon, Dr. Merlyn Lot was consulted, recommended to start patient with vancomycin.  He will see patient in morning. Pt is not septic. Lactate is normal. Hemodynamically stable.  -will place on med-surg bed for obs -start IV vancomycin(recently received 1 dose of Ancef) -Blood culture 2 -When necessary Zofran for nausea, Norco for pain -Decadron 10 mg 3 times a day -Benadryl 25 mg twice a day -f/u Hand surgeon's recommendation.  Microcytic anemia: Patient states that she had fibroid, which was removed 2007. She still has irregular menstrual period, which is likely the etiology. hgb 8.5 with MCV 73.2, indicating iron deficiency. -Check anemia panel  DVT ppx: SCD Code Status: Full code Family Communication: None at bed side.  Disposition Plan:  Anticipate discharge back to previous home environment Consults called:  Hydrographic surveyor, dr. Merlyn Lot Admission status: medical floor/obs  Date of Service 02/06/2016    Lorretta Harp [facility] Pager [phone]  If 7PM-7AM, please contact night-coverage [email or web address] Password [facility] 02/06/2016, 6:37 AM

## 2016-02-06 NOTE — Transfer of Care (Signed)
Immediate Anesthesia Transfer of Care Note  Patient: Hayley Carr D Anger  Procedure(s) Performed: Procedure(s): INCISION AND DRAINAGE ABSCESS (Right)  Patient Location: PACU  Anesthesia Type:General  Level of Consciousness: awake, alert , oriented and patient cooperative  Airway & Oxygen Therapy: Patient Spontanous Breathing  Post-op Assessment: Report given to RN, Post -op Vital signs reviewed and stable and Patient moving all extremities  Post vital signs: Reviewed and stable  Last Vitals:  Filed Vitals:   02/06/16 0249 02/06/16 0628  BP: 179/85 142/77  Pulse: 72 77  Temp: 36.6 C 36.6 C  Resp: 18 17    Last Pain:  Filed Vitals:   02/06/16 0629  PainSc: 3          Complications: No apparent anesthesia complications

## 2016-02-07 DIAGNOSIS — D509 Iron deficiency anemia, unspecified: Secondary | ICD-10-CM

## 2016-02-07 DIAGNOSIS — R609 Edema, unspecified: Secondary | ICD-10-CM

## 2016-02-07 LAB — IRON AND TIBC
Iron: 13 ug/dL — ABNORMAL LOW (ref 28–170)
SATURATION RATIOS: 4 % — AB (ref 10.4–31.8)
TIBC: 360 ug/dL (ref 250–450)
UIBC: 347 ug/dL

## 2016-02-07 LAB — CBC
HCT: 25.3 % — ABNORMAL LOW (ref 36.0–46.0)
HEMOGLOBIN: 7.6 g/dL — AB (ref 12.0–15.0)
MCH: 22.4 pg — AB (ref 26.0–34.0)
MCHC: 30 g/dL (ref 30.0–36.0)
MCV: 74.4 fL — AB (ref 78.0–100.0)
Platelets: 377 10*3/uL (ref 150–400)
RBC: 3.4 MIL/uL — ABNORMAL LOW (ref 3.87–5.11)
RDW: 19.5 % — ABNORMAL HIGH (ref 11.5–15.5)
WBC: 17.6 10*3/uL — ABNORMAL HIGH (ref 4.0–10.5)

## 2016-02-07 LAB — VITAMIN B12: Vitamin B-12: 234 pg/mL (ref 180–914)

## 2016-02-07 LAB — RETICULOCYTES
RBC.: 3.4 MIL/uL — ABNORMAL LOW (ref 3.87–5.11)
RETIC CT PCT: 2.3 % (ref 0.4–3.1)
Retic Count, Absolute: 78.2 10*3/uL (ref 19.0–186.0)

## 2016-02-07 LAB — FERRITIN: FERRITIN: 6 ng/mL — AB (ref 11–307)

## 2016-02-07 LAB — FOLATE: FOLATE: 17.5 ng/mL (ref >5.9)

## 2016-02-07 LAB — GLUCOSE, CAPILLARY: Glucose-Capillary: 128 mg/dL — ABNORMAL HIGH (ref 65–99)

## 2016-02-07 MED ORDER — FERROUS GLUCONATE 324 (38 FE) MG PO TABS
324.0000 mg | ORAL_TABLET | Freq: Three times a day (TID) | ORAL | Status: DC
  Administered 2016-02-07 – 2016-02-08 (×2): 324 mg via ORAL
  Filled 2016-02-07 (×4): qty 1

## 2016-02-07 NOTE — Progress Notes (Signed)
[facility]  PROGRESS NOTE  Obdulia Steier Odell ZHY:[id number] DOB: 04-25-1971 DOA: 02/05/2016 PCP: No primary care provider on file.    Brief HPI:  45 y.o. female with medical history significant of marijuana abuse, formal smoker, who presents with right arm and hand swelling and pain. Pt states that pt states that her boyfriend has poison oak and she has been helping him with treatment. This morning, her right hand and arm started swelling, and becoming red and painful. She also has a few blisters in right hand and forearm.   Principal Problem:   Swelling of right hand Active Problems:   Microcytic anemia   Hand edema   Assessment/Plan: 1. Right arm abscess- status post incision and drainage, continue IV vancomycin. Orthopedics following. Morphine when necessary for pain control. 2. Iron deficiency anemia- patient has severely low levels of iron 13 with saturation 4%. We'll start ferrous gluconate 324 mg tablet 3 times a day. Will transfuse for hemoglobin less than 7.   DVT prophylaxis: Lovenox Code Status: Full code Family Communication: No family member at bedside  Disposition Plan: Likely home in 1-2 days   Consultants:  Orthopedic surgeon  Procedures:  Left hand incision and drainage  Antibiotics:  Vancomycin  Subjective: Patient seen and examined, pain well-controlled with morphine when necessary  Objective: Filed Vitals:   02/06/16 1044 02/06/16 1300 02/06/16 2120 02/07/16 0445  BP: 161/94 156/85 150/78 143/73  Pulse: 76 88 90 78  Temp: 97.9 F (36.6 C) 98.2 F (36.8 C) 97.7 F (36.5 C) 97.7 F (36.5 C)  TempSrc:   Oral Oral  Resp: Height:      Weight:      SpO2: 100% 100% 100% 99%    Intake/Output Summary (Last 24 hours) at 02/07/16 1219 Last data filed at 02/07/16 2952  Gross per 24 hour  Intake   1780 ml  Output      0 ml  Net   1780 ml   Filed Weights   02/06/16 0205 02/06/16 0206 02/06/16 0249  Weight: 74.844 kg (165 lb)  74.844 kg (165 lb) 89.858 kg (198 lb 1.6 oz)    Examination:  General exam: Appears calm and comfortable  Respiratory system: Clear to auscultation. Respiratory effort normal. Cardiovascular system: S1 & S2 heard, RRR. No JVD, murmurs, rubs, gallops or clicks. No pedal edema. Gastrointestinal system: Abdomen is nondistended, soft and nontender. No organomegaly or masses felt. Normal bowel sounds heard. Central nervous system: Alert and oriented. No focal neurological deficits. Extremities: Right arm in dressing Skin: No rashes, lesions or ulcers Psychiatry: Judgement and insight appear normal. Mood & affect appropriate.    Data Reviewed: I have personally reviewed following labs and imaging studies Basic Metabolic Panel:  Recent Labs Lab 02/05/16 2340  NA 133*  K 3.5  CL 105  CO2 21*  GLUCOSE 100*  BUN 11  CREATININE 0.74  CALCIUM 9.1   Liver Function Tests: No results for input(s): AST, ALT, ALKPHOS, BILITOT, PROT, ALBUMIN in the last 168 hours. No results for input(s): LIPASE, AMYLASE in the last 168 hours. No results for input(s): AMMONIA in the last 168 hours. CBC:  Recent Labs Lab 02/05/16 2340 02/07/16 0539  WBC 10.0 17.6*  NEUTROABS 7.9*  --   HGB 8.5* 7.6*  HCT 27.6* 25.3*  MCV 73.2* 74.4*  PLT 394 377    CBG:  Recent Labs Lab 02/06/16 0628 02/07/16 0621  GLUCAP 136* 128*    Recent Results (from the  past 240 hour(s))  Aerobic Culture (superficial specimen)     Status: None (Preliminary result)   Collection Time: 02/06/16  8:05 AM  Result Value Ref Range Status   Specimen Description WOUND RIGHT ARM ABSCESS  Final   Special Requests PATIENT ON FOLLOWING  VANCOMYCIN  Final   Gram Stain   Final    FEW WBC PRESENT,BOTH PMN AND MONONUCLEAR NO ORGANISMS SEEN    Culture PENDING  Incomplete   Report Status PENDING  Incomplete     Studies: Dg Forearm Right  02/06/2016  CLINICAL DATA:  45 year old female with pain and swelling of the right upper  extremity EXAM: RIGHT FOREARM - 2 VIEW; RIGHT HAND - COMPLETE 3+ VIEW COMPARISON:  None. FINDINGS: There is no acute fracture or dislocation. The bones are well mineralized. No arthritic changes. There is diffuse soft tissue swelling of the dorsum of the hand and forearm with subcutaneous edema. No radiopaque foreign object or soft tissue gas. IMPRESSION: No acute osseous pathology. Diffuse soft tissue swelling of the dorsal aspect of the forearm and hand may represent cellulitis. Clinical correlation is recommended. Electronically Signed   By: Elgie CollardArash  Radparvar M.D.   On: 02/06/2016 01:57   Dg Hand Complete Right  02/06/2016  CLINICAL DATA:  45 year old female with pain and swelling of the right upper extremity EXAM: RIGHT FOREARM - 2 VIEW; RIGHT HAND - COMPLETE 3+ VIEW COMPARISON:  None. FINDINGS: There is no acute fracture or dislocation. The bones are well mineralized. No arthritic changes. There is diffuse soft tissue swelling of the dorsum of the hand and forearm with subcutaneous edema. No radiopaque foreign object or soft tissue gas. IMPRESSION: No acute osseous pathology. Diffuse soft tissue swelling of the dorsal aspect of the forearm and hand may represent cellulitis. Clinical correlation is recommended. Electronically Signed   By: Elgie CollardArash  Radparvar M.D.   On: 02/06/2016 01:57    Scheduled Meds: . diphenhydrAMINE  25 mg Oral BID  . enoxaparin (LOVENOX) injection  40 mg Subcutaneous Q24H  . vancomycin  1,000 mg Intravenous Q12H  . vitamin C  1,000 mg Oral Daily   Continuous Infusions: . sodium chloride 100 mL/hr at 02/06/16 1046  . lactated ringers         Time spent: 25 min    [facility],Armenta Erskin S  [facility] Pager [phone]. If 7PM-7AM, please contact night-coverage at [email or web address], Office  [phone]  password [email or web address] 02/07/2016, 12:19 PM  LOS: 1 day

## 2016-02-07 NOTE — Op Note (Signed)
NAME:  [name], [name]                ACCOUNT NO.:  [id number]  MEDICAL RECORD NO.:  [id number]  LOCATION:  5N24C                        FACILITY:  [facility]  PHYSICIAN:  Betha LoaKevin Rosebud Koenen, MD        DATE OF BIRTH:  June 08, 1971  DATE OF PROCEDURE:  02/06/2016 DATE OF DISCHARGE:                              OPERATIVE REPORT   PREOPERATIVE DIAGNOSIS:  Right hand and forearm abscess.  POSTOPERATIVE DIAGNOSIS:  Right hand and forearm abscess.  PROCEDURE:  Right hand and forearm incision and drainage.  SURGEON:  Betha LoaKevin Rosali Augello, MD.  ASSISTANT:  None.  ANESTHESIA:  General.  IV FLUIDS:  Per anesthesia flow sheet.  ESTIMATED BLOOD LOSS:  Minimal.  COMPLICATIONS:  None.  SPECIMENS:  Cultures to Micro.  TOURNIQUET TIME:  24 minutes.  DISPOSITION:  Stable to PACU.  INDICATIONS:  [name] is a 45 year old female, who states over the past 2 days, she has been having increasing pain, swelling, and erythema of the right hand and arm.  She had been helping her boyfriend with his poison oak.  No fevers, chills, or night sweats.  She was seen in the emergency department.  I was consulted for evaluation of the condition. On examination, she had a swollen erythematous dorsum of the hand and forearm.  She was tender to palpation in this forearm.  There was concern of possible superinfection from her poison oak.  I recommended incision and drainage in the operating room.  Risks, benefits, and alternatives of surgery were discussed including risk of blood loss; infection; damage to nerves, vessels, tendons, ligaments, bone; failure of surgery; need for additional surgery; complications with wound healing; continued pain; continued infection; need for repeat irrigation and debridement.  She voiced understanding of these risks and elected to proceed.  OPERATIVE COURSE:  After being identified preoperatively by myself, the patient and I agreed upon procedure and site of procedure.  Surgical site was  marked.  The risks, benefits, and alternatives of the surgery were reviewed and she wished to proceed.  Surgical consent had been signed.  She was transferred to the operating room and placed on the operating table in supine position with the right upper extremity on an armboard.  General anesthesia was induced by the anesthesiologist.  The right upper extremity was prepped and draped in normal sterile orthopedic fashion.  Surgical pause was performed between surgeons, anesthesia, operating staff; and all were in agreement as to the patient, procedure, and site procedure.  Tourniquet at the proximal aspect of the extremity was inflated to 250 mmHg after exsanguination of the limb with Esmarch bandage.  Incision was made over the dorsum of the hand.  This carried into subcutaneous tissues by spreading technique. Bovie electrocautery was used to obtain hemostasis.  There was significant amount of thin watery fluid.  No gross purulence.  The area underneath the tendons was opened, and there was no purulence underneath.  The subcutaneous tissues were spread both radially and ulnarly.  Again, no pockets of gross purulence were countered.  An incision was made over the distal forearm, where there was additional swelling and erythema.  This carried into subcutaneous tissues again by spreading technique.  Again,  the thin watery fluid was encountered.  No gross purulence.  The wounds were both copiously irrigated with 3000 mL of sterile saline by cysto tubing.  They were then packed with 0.5 inch iodoform gauze.  They were injected with 10 mL of 0.25% plain Marcaine to aid in postoperative analgesia.  They were then dressed with sterile 4x4s and ABD and wrapped with a Kerlix bandage.  A volar splint was placed and wrapped with Kerlix and Ace bandage.  Tourniquet was deflated at 24 minutes.  Fingertips were pink with brisk capillary refill after deflation of tourniquet.  Operative drapes were broken  down.  The patient was awoken from anesthesia safely.  She was transferred back to stretcher and taken to PACU in stable condition.  I will see her back in the office at the beginning of the next week for followup.  She is admitted to the hospitalist currently.  We will start hydrotherapy with packing change in 2-3 days.  When she is afebrile with normal white count and comfortable, she can go home and we can pick up in the office.     Betha Loa, MD     KK/MEDQ  D:  02/06/2016  T:  02/07/2016  Job:  [id number]

## 2016-02-08 LAB — CBC
HEMATOCRIT: 27.8 % — AB (ref 36.0–46.0)
Hemoglobin: 8.3 g/dL — ABNORMAL LOW (ref 12.0–15.0)
MCH: 22.4 pg — AB (ref 26.0–34.0)
MCHC: 29.9 g/dL — ABNORMAL LOW (ref 30.0–36.0)
MCV: 74.9 fL — AB (ref 78.0–100.0)
PLATELETS: 247 10*3/uL (ref 150–400)
RBC: 3.71 MIL/uL — AB (ref 3.87–5.11)
RDW: 19.9 % — ABNORMAL HIGH (ref 11.5–15.5)
WBC: 12.7 10*3/uL — AB (ref 4.0–10.5)

## 2016-02-08 LAB — GLUCOSE, CAPILLARY: Glucose-Capillary: 92 mg/dL (ref 65–99)

## 2016-02-08 MED ORDER — FERROUS GLUCONATE 324 (38 FE) MG PO TABS: 324.0000 mg | ORAL_TABLET | Freq: Three times a day (TID) | ORAL | Status: AC

## 2016-02-08 MED ORDER — DOXYCYCLINE HYCLATE 50 MG PO CAPS: 50.0000 mg | ORAL_CAPSULE | Freq: Two times a day (BID) | ORAL | Status: DC

## 2016-02-08 MED ORDER — OXYCODONE-ACETAMINOPHEN 5-325 MG PO TABS: 1.0000 | ORAL_TABLET | Freq: Four times a day (QID) | ORAL | Status: DC | PRN

## 2016-02-08 NOTE — Progress Notes (Signed)
Physician Discharge Summary  Hayley Carr ZOX:[id number] DOB: 03/27/1971 DOA: 02/05/2016  PCP: No primary care provider on filAida Puffere.  Admit date: 02/05/2016 Discharge date: 02/08/2016  Time spent: 25 minutes  Recommendations for Outpatient Follow-up:  1. Follow up Dr Merlyn LotKuzma  On 02/09/16   Discharge Diagnoses:  Principal Problem:   Swelling of right hand Active Problems:   Microcytic anemia   Hand edema   Discharge Condition: Stable  Diet recommendation: Regular diet  Filed Weights   02/06/16 0205 02/06/16 0206 02/06/16 0249  Weight: 74.844 kg (165 lb) 74.844 kg (165 lb) 89.858 kg (198 lb 1.6 oz)    History of present illness:  45 y.o. female with medical history significant of marijuana abuse, formal smoker, who presents with right arm and hand swelling and pain. Pt states that pt states that her boyfriend has poison oak and she has been helping him with treatment. This morning, her right hand and arm started swelling, and becoming red and painful. She also has a few blisters in right hand and forearm. Her pain is constant, 8 out of 10 in severity, nonradiating. Patient does not have fever or chills. No nausea, vomiting. Denies chest pain, shortness of breath, cough, symptoms of UTI or unilateral weakness.  Hospital Course:  1. Right arm abscess- status post incision and drainage, patient was started on  IV vancomycin. Orthopedics will follow up as outpatient. 2. Iron deficiency anemia- patient has severely low levels of iron 13 with saturation 4%. We'll start ferrous gluconate 324 mg tablet 3 times a day.   Procedures:  S/p incision and drainage  Consultations:  Orthopedics  Discharge Exam: Filed Vitals:   02/07/16 2043 02/08/16 0628  BP: 147/70 133/67  Pulse: 76 65  Temp: 98.3 F (36.8 C) 98.2 F (36.8 C)  Resp: 16 16    General: Appears in no acute distress Cardiovascular: S1S2 RRR Respiratory: Clear bilaterally  Discharge Instructions   Discharge  Instructions    Diet - low sodium heart healthy    Complete by:  As directed      Increase activity slowly    Complete by:  As directed           Current Discharge Medication List    START taking these medications   Details  doxycycline (VIBRAMYCIN) 50 MG capsule Take 1 capsule (50 mg total) by mouth 2 (two) times daily. Qty: 10 capsule, Refills: 0    ferrous gluconate (FERGON) 324 MG tablet Take 1 tablet (324 mg total) by mouth 3 (three) times daily with meals. Qty: 90 tablet, Refills: 3    oxyCODONE-acetaminophen (PERCOCET/ROXICET) 5-325 MG tablet Take 1 tablet by mouth every 6 (six) hours as needed for moderate pain. Qty: 20 tablet, Refills: 0      CONTINUE these medications which have NOT CHANGED   Details  methocarbamol (ROBAXIN-750) 750 MG tablet Take 1 tablet (750 mg total) by mouth every 6 (six) hours as needed for muscle spasms. Qty: 40 tablet, Refills: 0      STOP taking these medications     HYDROcodone-acetaminophen (NORCO/VICODIN) 5-325 MG per tablet        Allergies  Allergen Reactions  . Ibuprofen Nausea Only   Follow-up Information    Follow up with Tami RibasKUZMA,KEVIN R, MD On 02/09/2016.   Specialty:  Orthopedic Surgery   Contact information:   [address] [place] [address] [phone]        The results of significant diagnostics from this hospitalization (including imaging, microbiology, ancillary  and laboratory) are listed below for reference.    Significant Diagnostic Studies: Dg Forearm Right  02/06/2016  CLINICAL DATA:  45 year old female with pain and swelling of the right upper extremity EXAM: RIGHT FOREARM - 2 VIEW; RIGHT HAND - COMPLETE 3+ VIEW COMPARISON:  None. FINDINGS: There is no acute fracture or dislocation. The bones are well mineralized. No arthritic changes. There is diffuse soft tissue swelling of the dorsum of the hand and forearm with subcutaneous edema. No radiopaque foreign object or soft tissue gas. IMPRESSION: No acute  osseous pathology. Diffuse soft tissue swelling of the dorsal aspect of the forearm and hand may represent cellulitis. Clinical correlation is recommended. Electronically Signed   By: Elgie Collard M.D.   On: 02/06/2016 01:57   Dg Hand Complete Right  02/06/2016  CLINICAL DATA:  45 year old female with pain and swelling of the right upper extremity EXAM: RIGHT FOREARM - 2 VIEW; RIGHT HAND - COMPLETE 3+ VIEW COMPARISON:  None. FINDINGS: There is no acute fracture or dislocation. The bones are well mineralized. No arthritic changes. There is diffuse soft tissue swelling of the dorsum of the hand and forearm with subcutaneous edema. No radiopaque foreign object or soft tissue gas. IMPRESSION: No acute osseous pathology. Diffuse soft tissue swelling of the dorsal aspect of the forearm and hand may represent cellulitis. Clinical correlation is recommended. Electronically Signed   By: Elgie Collard M.D.   On: 02/06/2016 01:57    Microbiology: Recent Results (from the past 240 hour(s))  Aerobic Culture (superficial specimen)     Status: None (Preliminary result)   Collection Time: 02/06/16  8:05 AM  Result Value Ref Range Status   Specimen Description WOUND RIGHT ARM ABSCESS  Final   Special Requests PATIENT ON FOLLOWING  VANCOMYCIN  Final   Gram Stain   Final    FEW WBC PRESENT,BOTH PMN AND MONONUCLEAR NO ORGANISMS SEEN    Culture NO GROWTH 1 DAY  Final   Report Status PENDING  Incomplete     Labs: Basic Metabolic Panel:  Recent Labs Lab 02/05/16 2340  NA 133*  K 3.5  CL 105  CO2 21*  GLUCOSE 100*  BUN 11  CREATININE 0.74  CALCIUM 9.1   CBC:  Recent Labs Lab 02/05/16 2340 02/07/16 0539 02/08/16 0246  WBC 10.0 17.6* 12.7*  NEUTROABS 7.9*  --   --   HGB 8.5* 7.6* 8.3*  HCT 27.6* 25.3* 27.8*  MCV 73.2* 74.4* 74.9*  PLT 394 377 247    CBG:  Recent Labs Lab 02/06/16 0628 02/07/16 0621 02/08/16 0627  GLUCAP 136* 128* 92       Signed:  Mauro Kaufmann S MD.   [facility] 02/08/2016, 9:25 AM

## 2016-02-08 NOTE — Care Management Note (Signed)
Case Management Note  Patient Details  Name: Hayley Carr MRN: [id number] Date of Birth: May 19, 1971  Subjective/Objective:                  Microcytic anemia  Hand edema Action/Plan:  Discharge planning Expected Discharge Date:  02/08/16              Expected Discharge Plan:  Home/Self Care  In-House Referral:     Discharge planning Services  CM Consult  Post Acute Care Choice:    Choice offered to:  Patient  DME Arranged:  N/A DME Agency:  NA  HH Arranged:  NA HH Agency:  NA  Status of Service:     If discussed at [place] of Stay Meetings, dates discussed:    Additional Comments: CM met with pt in room and gave pt [place] letter with list of participating pharmacies.  Pt verbalized understanding of all MATCH parameters.  CM also gave pt [facility] pamphlet (also on AVS) and pt verbalized understanding she will go to the open clinic at 08:30 on Thursday morning and ask for: AN APPT FOR A PCP; AN APPT FOR FOLLOW UP MEDICAL CARE; AN APPT WITH A NAVIGATOR TO SECURE INSURANCE. No other CM needs were communicated. Dellie Catholic, RN 02/08/2016, 1:13 PM

## 2016-02-09 ENCOUNTER — Encounter (HOSPITAL_COMMUNITY): Payer: Self-pay | Admitting: Orthopedic Surgery

## 2016-02-10 LAB — AEROBIC CULTURE W GRAM STAIN (SUPERFICIAL SPECIMEN)

## 2016-02-10 LAB — AEROBIC CULTURE  (SUPERFICIAL SPECIMEN): CULTURE: NO GROWTH

## 2016-02-11 LAB — ANAEROBIC CULTURE

## 2016-02-25 NOTE — Discharge Summary (Signed)
Physician Discharge Summary  Charl Wellen Incorvaia ZOX:[id number] DOB: Jul 16, 1971 DOA: 02/05/2016  PCP: No primary care provider on file.  Admit date: 02/05/2016 Discharge date: 02/08/2016  Time spent: 25 minutes  Recommendations for Outpatient Follow-up:  1. Follow up Dr Merlyn Lot  On 02/09/16   Discharge Diagnoses:  Principal Problem:   Swelling of right hand Active Problems:   Microcytic anemia   Hand edema   Discharge Condition: Stable  Diet recommendation: Regular diet       Filed Weights   02/06/16 0205 02/06/16 0206 02/06/16 0249  Weight: 74.844 kg (165 lb) 74.844 kg (165 lb) 89.858 kg (198 lb 1.6 oz)    History of present illness:  45 y.o. female with medical history significant of marijuana abuse, formal smoker, who presents with right arm and hand swelling and pain. Pt states that pt states that her boyfriend has poison oak and she has been helping him with treatment. This morning, her right hand and arm started swelling, and becoming red and painful. She also has a few blisters in right hand and forearm. Her pain is constant, 8 out of 10 in severity, nonradiating. Patient does not have fever or chills. No nausea, vomiting. Denies chest pain, shortness of breath, cough, symptoms of UTI or unilateral weakness.  Hospital Course:  1. Right arm abscess- status post incision and drainage, patient was started on  IV vancomycin. Orthopedics will follow up as outpatient. 2. Iron deficiency anemia- patient has severely low levels of iron 13 with saturation 4%. We'll start ferrous gluconate 324 mg tablet 3 times a day.   Procedures:  S/p incision and drainage  Consultations:  Orthopedics  Discharge Exam:     Filed Vitals:   02/07/16 2043 02/08/16 0628  BP: 147/70 133/67  Pulse: 76 65  Temp: 98.3 F (36.8 C) 98.2 F (36.8 C)  Resp: 16 16    General: Appears in no acute distress Cardiovascular: S1S2 RRR Respiratory: Clear bilaterally  Discharge  Instructions       Discharge Instructions    Diet - low sodium heart healthy    Complete by:  As directed      Increase activity slowly    Complete by:  As directed               Current Discharge Medication List    START taking these medications   Details  doxycycline (VIBRAMYCIN) 50 MG capsule Take 1 capsule (50 mg total) by mouth 2 (two) times daily. Qty: 10 capsule, Refills: 0    ferrous gluconate (FERGON) 324 MG tablet Take 1 tablet (324 mg total) by mouth 3 (three) times daily with meals. Qty: 90 tablet, Refills: 3    oxyCODONE-acetaminophen (PERCOCET/ROXICET) 5-325 MG tablet Take 1 tablet by mouth every 6 (six) hours as needed for moderate pain. Qty: 20 tablet, Refills: 0      CONTINUE these medications which have NOT CHANGED   Details  methocarbamol (ROBAXIN-750) 750 MG tablet Take 1 tablet (750 mg total) by mouth every 6 (six) hours as needed for muscle spasms. Qty: 40 tablet, Refills: 0      STOP taking these medications     HYDROcodone-acetaminophen (NORCO/VICODIN) 5-325 MG per tablet        Allergies  Allergen Reactions  . Ibuprofen Nausea Only      Follow-up Information    Follow up with Tami Ribas, MD On 02/09/2016.   Specialty:  Orthopedic Surgery   Contact information:   [address] [place] [address]  [phone]        The results of significant diagnostics from this hospitalization (including imaging, microbiology, ancillary and laboratory) are listed below for reference.    Significant Diagnostic Studies:  Imaging Results  Dg Forearm Right  02/06/2016  CLINICAL DATA:  45 year old female with pain and swelling of the right upper extremity EXAM: RIGHT FOREARM - 2 VIEW; RIGHT HAND - COMPLETE 3+ VIEW COMPARISON:  None. FINDINGS: There is no acute fracture or dislocation. The bones are well mineralized. No arthritic changes. There is diffuse soft tissue swelling of the dorsum of the hand and  forearm with subcutaneous edema. No radiopaque foreign object or soft tissue gas. IMPRESSION: No acute osseous pathology. Diffuse soft tissue swelling of the dorsal aspect of the forearm and hand may represent cellulitis. Clinical correlation is recommended. Electronically Signed   By: Elgie Collard M.D.   On: 02/06/2016 01:57   Dg Hand Complete Right  02/06/2016  CLINICAL DATA:  45 year old female with pain and swelling of the right upper extremity EXAM: RIGHT FOREARM - 2 VIEW; RIGHT HAND - COMPLETE 3+ VIEW COMPARISON:  None. FINDINGS: There is no acute fracture or dislocation. The bones are well mineralized. No arthritic changes. There is diffuse soft tissue swelling of the dorsum of the hand and forearm with subcutaneous edema. No radiopaque foreign object or soft tissue gas. IMPRESSION: No acute osseous pathology. Diffuse soft tissue swelling of the dorsal aspect of the forearm and hand may represent cellulitis. Clinical correlation is recommended. Electronically Signed   By: Elgie Collard M.D.   On: 02/06/2016 01:57     Microbiology:        Recent Results (from the past 240 hour(s))  Aerobic Culture (superficial specimen)     Status: None (Preliminary result)   Collection Time: 02/06/16  8:05 AM  Result Value Ref Range Status   Specimen Description WOUND RIGHT ARM ABSCESS  Final   Special Requests PATIENT ON FOLLOWING  VANCOMYCIN  Final   Gram Stain   Final    FEW WBC PRESENT,BOTH PMN AND MONONUCLEAR NO ORGANISMS SEEN   Culture NO GROWTH 1 DAY  Final   Report Status PENDING  Incomplete     Labs: Basic Metabolic Panel:  Last Labs    Recent Labs Lab 02/05/16 2340  NA 133*  K 3.5  CL 105  CO2 21*  GLUCOSE 100*  BUN 11  CREATININE 0.74  CALCIUM 9.1     CBC:  Last Labs    Recent Labs Lab 02/05/16 2340 02/07/16 0539 02/08/16 0246  WBC 10.0 17.6* 12.7*  NEUTROABS 7.9*  --   --   HGB 8.5* 7.6* 8.3*  HCT 27.6* 25.3* 27.8*  MCV 73.2* 74.4*  74.9*  PLT 394 377 247      CBG:  Last Labs    Recent Labs Lab 02/06/16 0628 02/07/16 0621 02/08/16 0627  GLUCAP 136* 128* 92         Signed:  Mauro Kaufmann S MD.  [facility] 02/08/2016, 9:25 AM

## 2017-08-05 IMAGING — CR DG HAND COMPLETE 3+V*R*
3 series · 3 of 3 positions shown · non-contrast
Comparison: None.

CLINICAL DATA: 44-year-old female with pain and swelling of the
right upper extremity

EXAM:
RIGHT FOREARM - 2 VIEW; RIGHT HAND - COMPLETE 3+ VIEW

[hand pa]
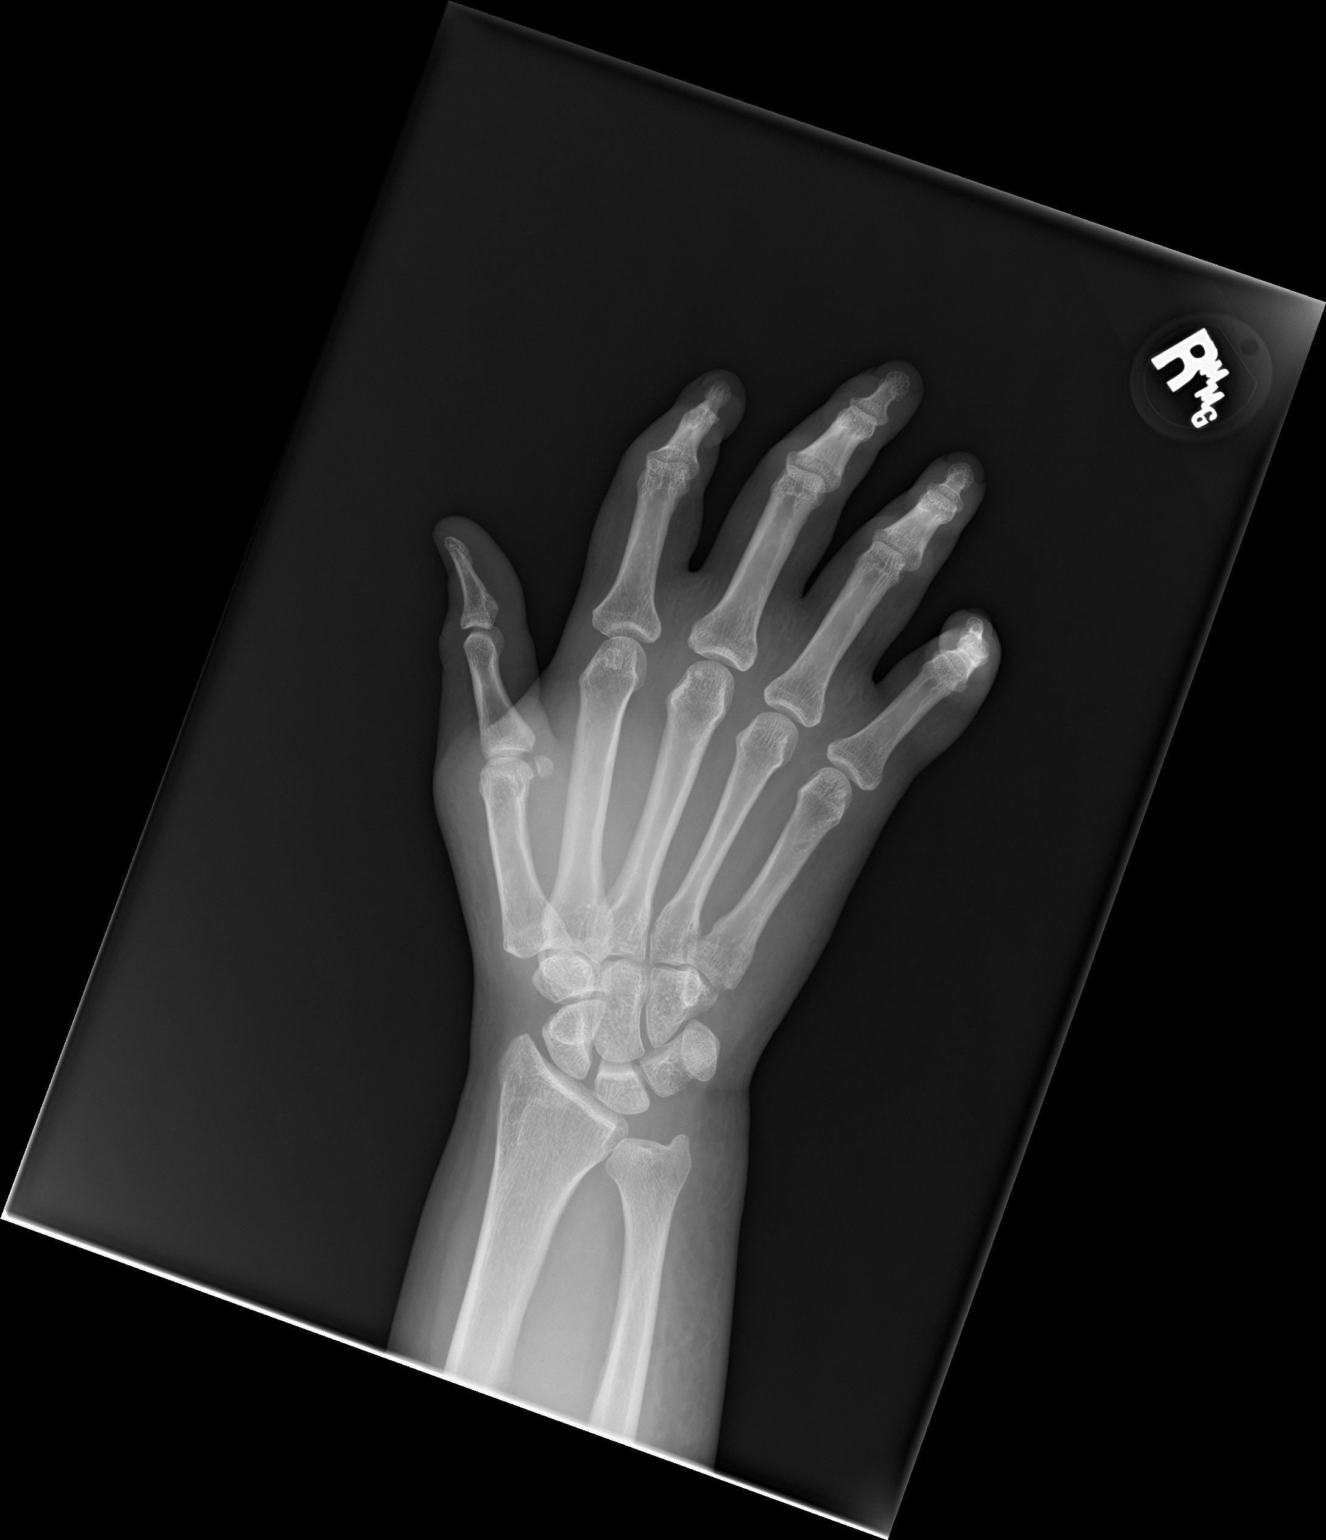

[hand obl]
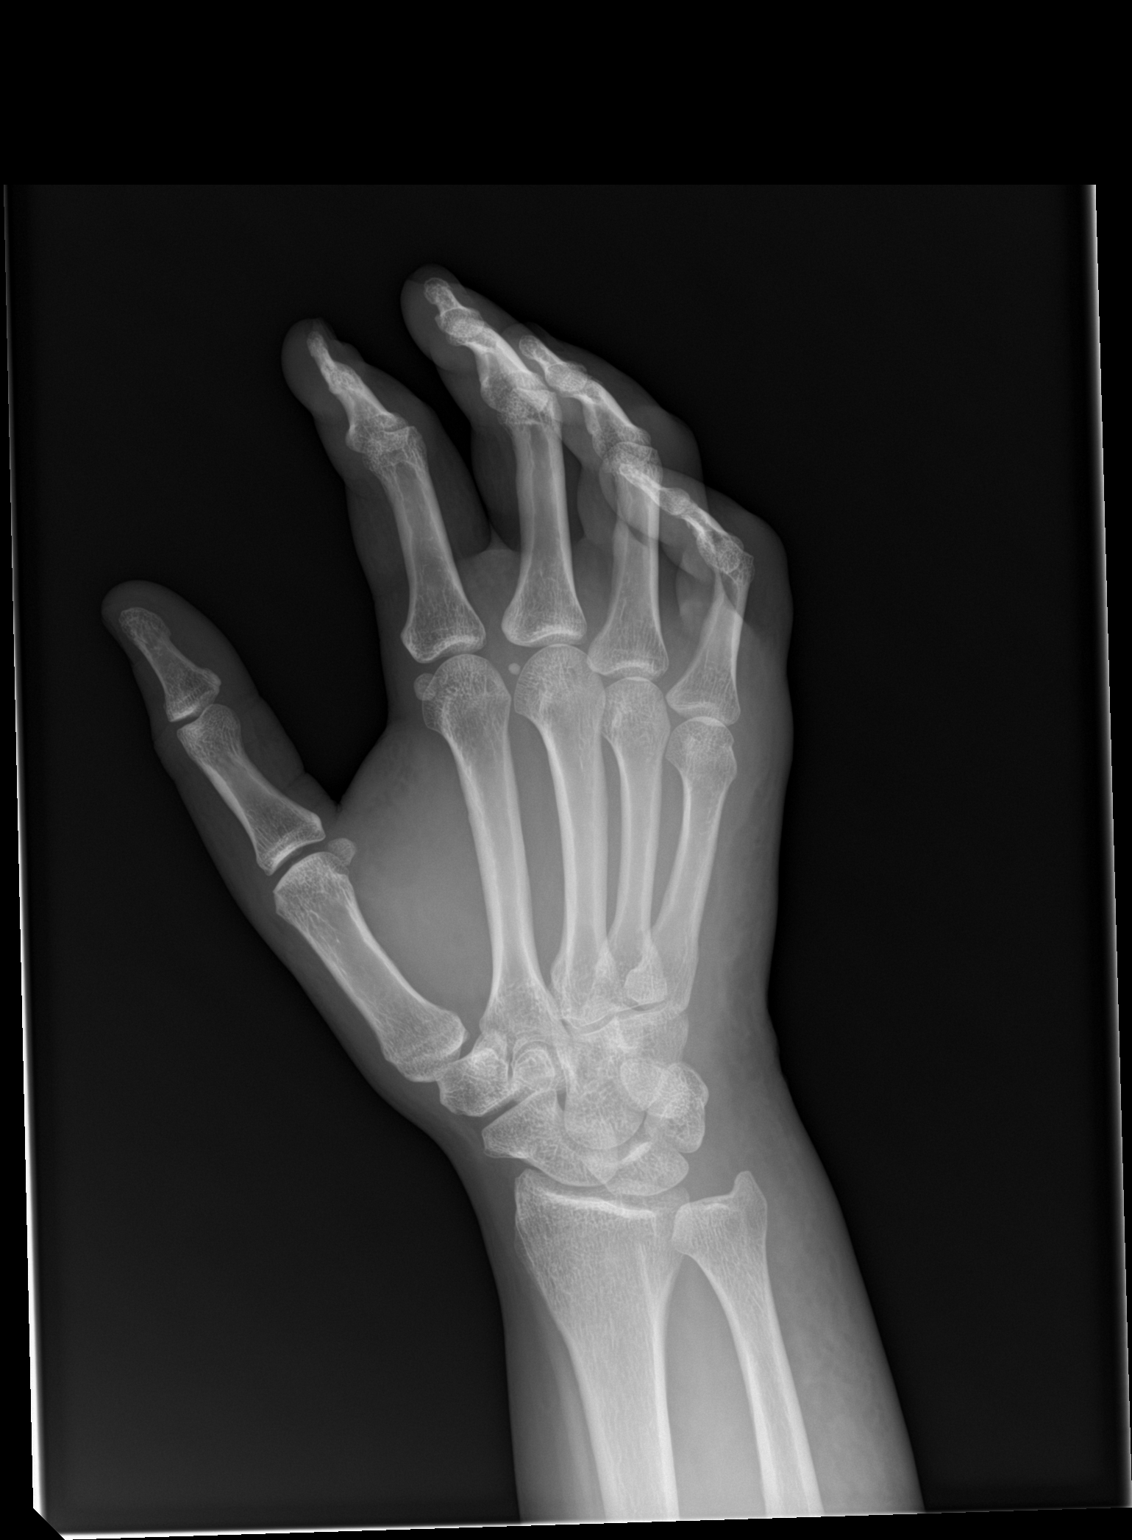

[hand lat]
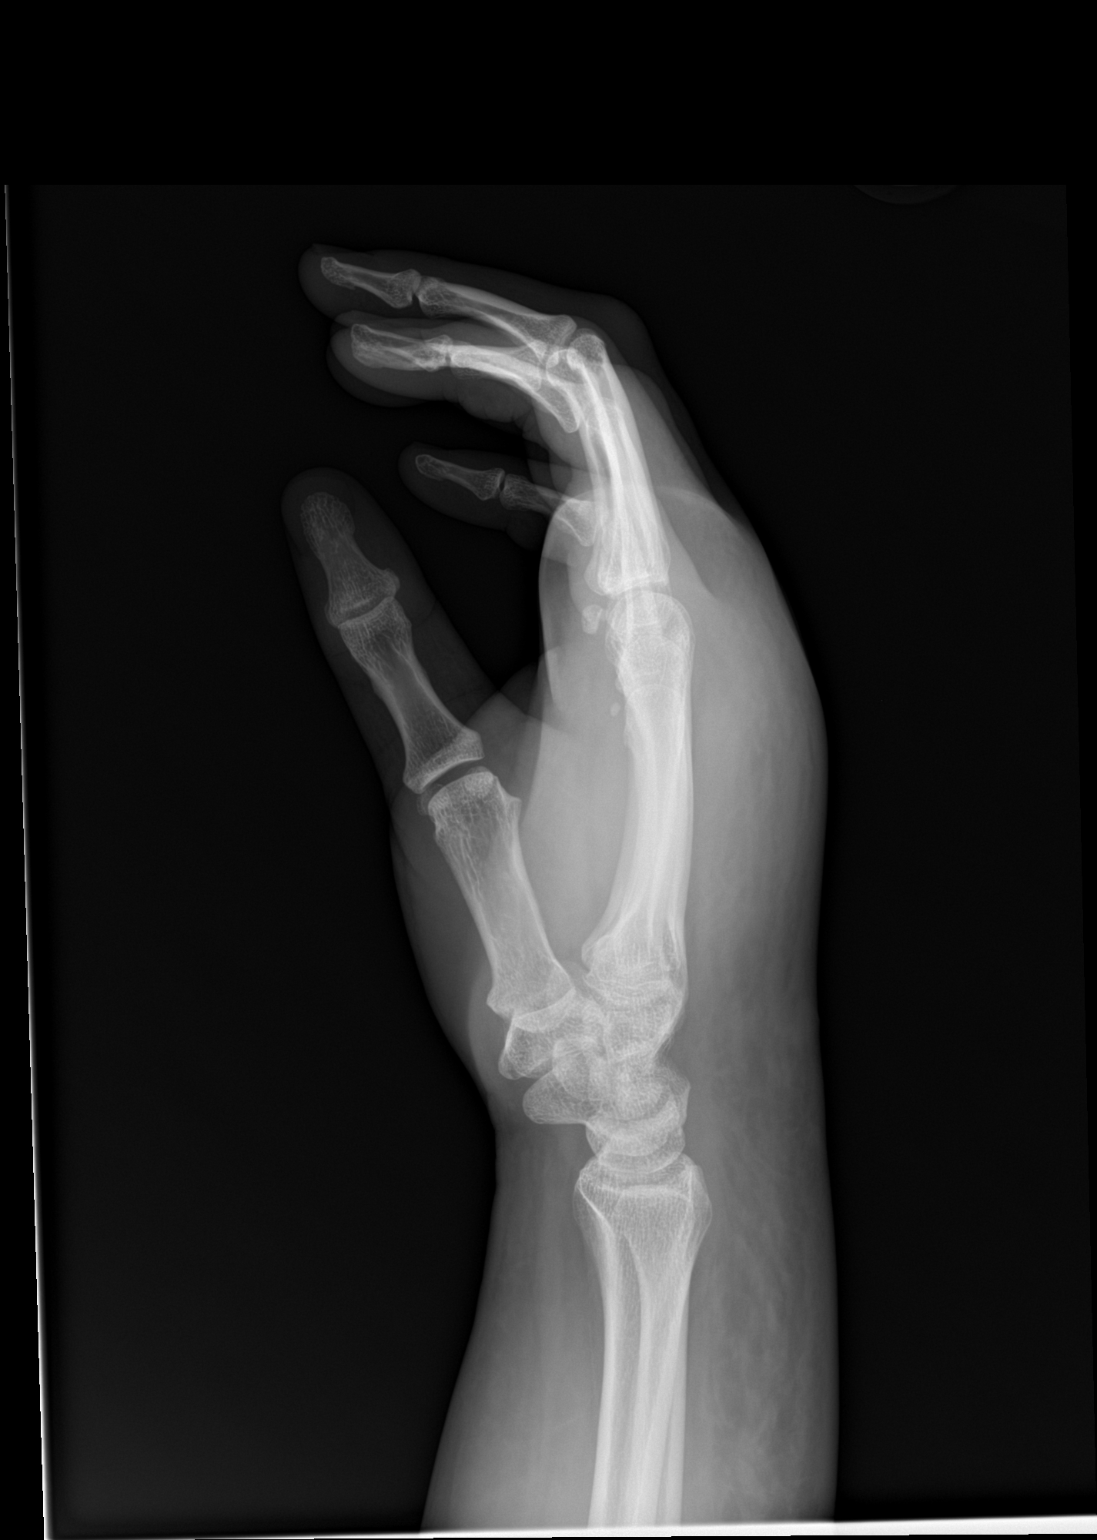

[3 of 3 positions shown; findings below may reference images not displayed]

FINDINGS: There is no acute fracture or dislocation. The bones are well
mineralized. No arthritic changes. There is diffuse soft tissue
swelling of the dorsum of the hand and forearm with subcutaneous
edema. No radiopaque foreign object or soft tissue gas.
IMPRESSION: No acute osseous pathology.

Diffuse soft tissue swelling of the dorsal aspect of the forearm and
hand may represent cellulitis. Clinical correlation is recommended.

## 2017-08-05 IMAGING — CR DG FOREARM 2V*R*
2 series · 2 of 2 positions shown · non-contrast
Comparison: None.

CLINICAL DATA: 44-year-old female with pain and swelling of the
right upper extremity

EXAM:
RIGHT FOREARM - 2 VIEW; RIGHT HAND - COMPLETE 3+ VIEW

[forearm ap]
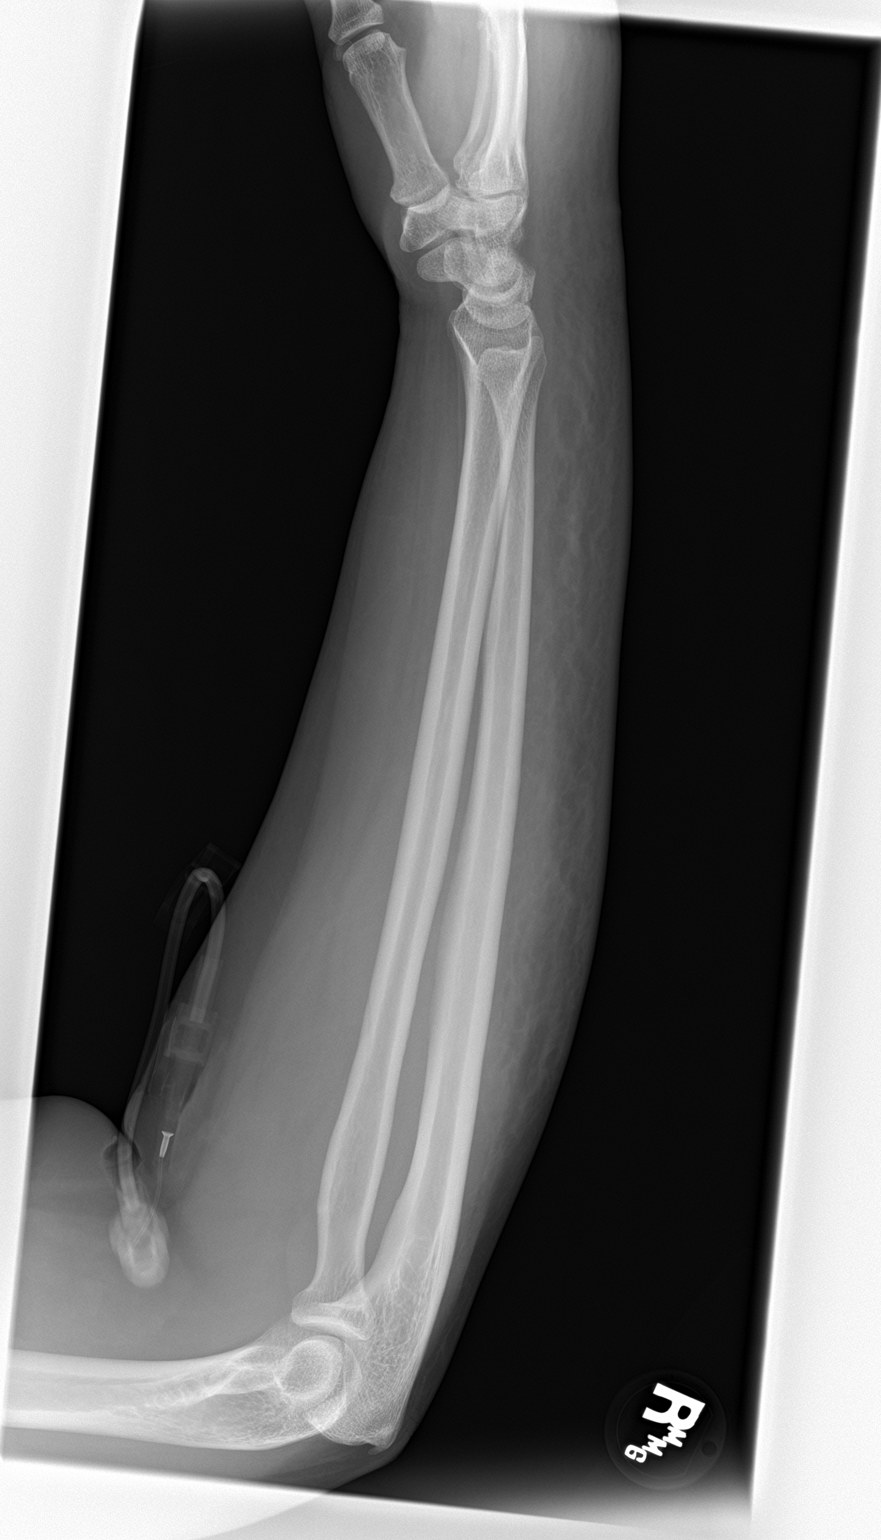

[forearm lat]
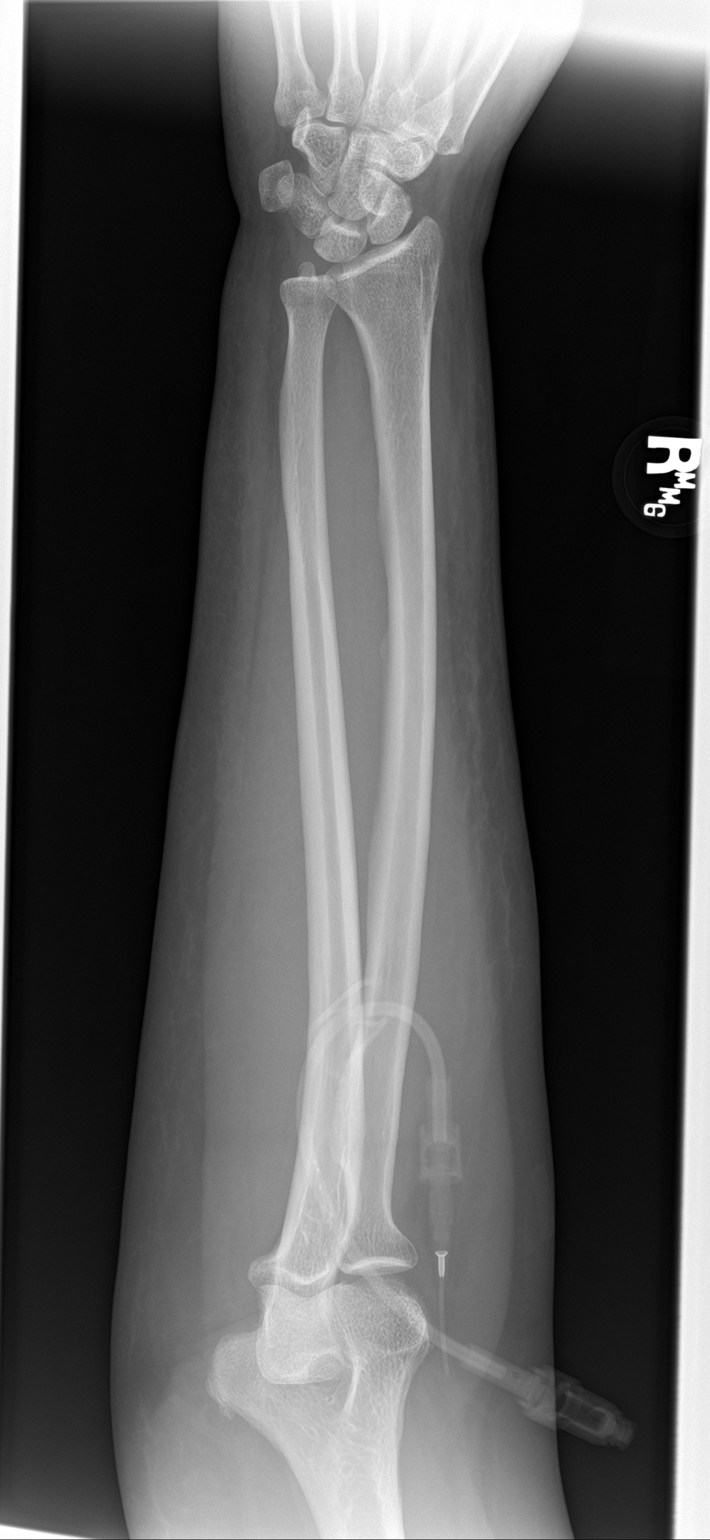

[2 of 2 positions shown; findings below may reference images not displayed]

FINDINGS: There is no acute fracture or dislocation. The bones are well
mineralized. No arthritic changes. There is diffuse soft tissue
swelling of the dorsum of the hand and forearm with subcutaneous
edema. No radiopaque foreign object or soft tissue gas.
IMPRESSION: No acute osseous pathology.

Diffuse soft tissue swelling of the dorsal aspect of the forearm and
hand may represent cellulitis. Clinical correlation is recommended.

## 2019-02-07 ENCOUNTER — Other Ambulatory Visit: Payer: Self-pay

## 2019-02-07 DIAGNOSIS — Z20822 Contact with and (suspected) exposure to covid-19: Secondary | ICD-10-CM

## 2019-02-11 LAB — NOVEL CORONAVIRUS, NAA: SARS-CoV-2, NAA: NOT DETECTED

## 2021-11-11 ENCOUNTER — Emergency Department (HOSPITAL_COMMUNITY)
Admission: EM | Admit: 2021-11-11 | Discharge: 2021-11-11 | Disposition: A | Discharge status: Home / Self Care | Payer: Self-pay | Attending: Emergency Medicine | Admitting: Emergency Medicine

## 2021-11-11 ENCOUNTER — Encounter (HOSPITAL_COMMUNITY): Payer: Self-pay | Admitting: Emergency Medicine

## 2021-11-11 ENCOUNTER — Emergency Department (HOSPITAL_COMMUNITY): Payer: Self-pay

## 2021-11-11 ENCOUNTER — Other Ambulatory Visit: Payer: Self-pay

## 2021-11-11 DIAGNOSIS — S51851A Open bite of right forearm, initial encounter: Secondary | ICD-10-CM | POA: Diagnosis not present

## 2021-11-11 DIAGNOSIS — Z79899 Other long term (current) drug therapy: Secondary | ICD-10-CM | POA: Diagnosis not present

## 2021-11-11 DIAGNOSIS — W503XXA Accidental bite by another person, initial encounter: Secondary | ICD-10-CM

## 2021-11-11 DIAGNOSIS — S59911A Unspecified injury of right forearm, initial encounter: Secondary | ICD-10-CM | POA: Diagnosis present

## 2021-11-11 DIAGNOSIS — Z23 Encounter for immunization: Secondary | ICD-10-CM | POA: Insufficient documentation

## 2021-11-11 DIAGNOSIS — I10 Essential (primary) hypertension: Secondary | ICD-10-CM | POA: Insufficient documentation

## 2021-11-11 MED ORDER — BACITRACIN ZINC 500 UNIT/GM EX OINT: 1.0000 "application " | TOPICAL_OINTMENT | Freq: Two times a day (BID) | CUTANEOUS | 0 refills | Status: AC

## 2021-11-11 MED ORDER — AMOXICILLIN-POT CLAVULANATE 875-125 MG PO TABS
1.0000 | ORAL_TABLET | Freq: Once | ORAL | Status: AC
  Administered 2021-11-11: 1 via ORAL
  Filled 2021-11-11: qty 1

## 2021-11-11 MED ORDER — AMOXICILLIN-POT CLAVULANATE 875-125 MG PO TABS: 1.0000 | ORAL_TABLET | Freq: Two times a day (BID) | ORAL | 0 refills | Status: AC

## 2021-11-11 MED ORDER — TETANUS-DIPHTH-ACELL PERTUSSIS 5-2.5-18.5 LF-MCG/0.5 IM SUSY
0.5000 mL | PREFILLED_SYRINGE | Freq: Once | INTRAMUSCULAR | Status: AC
  Administered 2021-11-11: 0.5 mL via INTRAMUSCULAR
  Filled 2021-11-11: qty 0.5

## 2021-11-11 NOTE — Discharge Instructions (Addendum)
Please take the antibiotics that are prescribed.  Apply ice to the affected area. ? ?Keep the area clean and dry and the raw area covered. Apply bacitracin ointment daily and take the medications provided. ?RETURN TO THE ER IF THERE IS INCREASED PAIN, REDNESS, PUS COMING OUT from the wound site. ? ? ?
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
## 2021-11-11 NOTE — ED Notes (Signed)
Pt wounds dressed and cleaned. Pt given supplies to take home to care for wounds.  ?

## 2023-12-09 ENCOUNTER — Other Ambulatory Visit: Payer: Self-pay | Admitting: Student

## 2023-12-09 ENCOUNTER — Encounter (HOSPITAL_COMMUNITY): Payer: Self-pay

## 2023-12-09 ENCOUNTER — Emergency Department (HOSPITAL_COMMUNITY)
Admission: EM | Admit: 2023-12-09 | Discharge: 2023-12-09 | Disposition: A | Discharge status: Home / Self Care | Payer: Worker's Compensation | Attending: Emergency Medicine | Admitting: Emergency Medicine

## 2023-12-09 ENCOUNTER — Ambulatory Visit
Admission: RE | Admit: 2023-12-09 | Discharge: 2023-12-09 | Disposition: A | Discharge status: Home / Self Care | Payer: Worker's Compensation | Source: Ambulatory Visit | Attending: Student | Admitting: Student

## 2023-12-09 ENCOUNTER — Other Ambulatory Visit: Payer: Self-pay

## 2023-12-09 DIAGNOSIS — X58XXXA Exposure to other specified factors, initial encounter: Secondary | ICD-10-CM | POA: Diagnosis not present

## 2023-12-09 DIAGNOSIS — Y990 Civilian activity done for income or pay: Secondary | ICD-10-CM | POA: Diagnosis not present

## 2023-12-09 DIAGNOSIS — R030 Elevated blood-pressure reading, without diagnosis of hypertension: Secondary | ICD-10-CM | POA: Insufficient documentation

## 2023-12-09 DIAGNOSIS — S82831A Other fracture of upper and lower end of right fibula, initial encounter for closed fracture: Secondary | ICD-10-CM | POA: Diagnosis not present

## 2023-12-09 DIAGNOSIS — S8991XA Unspecified injury of right lower leg, initial encounter: Secondary | ICD-10-CM | POA: Diagnosis present

## 2023-12-09 DIAGNOSIS — R269 Unspecified abnormalities of gait and mobility: Secondary | ICD-10-CM | POA: Insufficient documentation

## 2023-12-09 DIAGNOSIS — S93401A Sprain of unspecified ligament of right ankle, initial encounter: Secondary | ICD-10-CM

## 2023-12-09 MED ORDER — HYDROCODONE-ACETAMINOPHEN 5-325 MG PO TABS
1.0000 | ORAL_TABLET | Freq: Once | ORAL | Status: AC
  Administered 2023-12-09: 1 via ORAL
  Filled 2023-12-09: qty 1

## 2023-12-09 MED ORDER — OXYCODONE-ACETAMINOPHEN 5-325 MG PO TABS: 1.0000 | ORAL_TABLET | Freq: Four times a day (QID) | ORAL | 0 refills | Status: AC | PRN

## 2023-12-09 NOTE — Progress Notes (Signed)
 Orthopedic Tech Progress Note Patient Details:  Kinjal Sonner Eland 02-Dec-1970 [id number]  Ortho Devices Type of Ortho Device: Short leg splint, Stirrup splint Ortho Device/Splint Location: RLE Ortho Device/Splint Interventions: Application   Post Interventions Patient Tolerated: Well Instructions Provided: Care of device  Vashti Bolanos E Eshal Propps 12/09/2023, 4:30 PM

## 2023-12-09 NOTE — ED Provider Notes (Signed)
 [place] EMERGENCY DEPARTMENT AT [facility] Provider Note   CSN: [id number] Arrival date & time: 12/09/23  1527    History  Chief Complaint  Patient presents with   Ankle Pain    Hayley Carr is a 53 y.o. female here for evaluation of right ankle pain. Slipped earlier today on wet grass and inverted her right ankle.  Has pain to the lateral aspect of her right ankle since.  Did not hit her head.  Nontender knee, midshaft tib-fib.  She was seen by an urgent care and had some imaging performed and subsequently sent here after being placed in a walking boot however they did tell her not to put pressure on the leg.  No numbness or weakness.  No overlying skin changes aside from some swelling       HPI    Home Medications Prior to Admission medications   Medication Sig Start Date End Date Taking? Authorizing Provider  oxyCODONE -acetaminophen  (PERCOCET/ROXICET) 5-325 MG tablet Take 1 tablet by mouth every 6 (six) hours as needed for severe pain (pain score 7-10). 12/09/23  Yes Joeline Freer A, PA-C  amoxicillin -clavulanate (AUGMENTIN ) 875-125 MG tablet Take 1 tablet by mouth every 12 (twelve) hours. 11/11/21   Deatra Face, MD  bacitracin  ointment Apply 1 application. topically 2 (two) times daily. 11/11/21   Deatra Face, MD  ferrous gluconate  (FERGON) 324 MG tablet Take 1 tablet (324 mg total) by mouth 3 (three) times daily with meals. 02/08/16   Ozell Blunt, MD      Allergies    Ibuprofen    Review of Systems   Review of Systems  Constitutional: Negative.   HENT: Negative.    Respiratory: Negative.    Cardiovascular: Negative.   Gastrointestinal: Negative.   Genitourinary: Negative.   Musculoskeletal:        Right ankle pain  Neurological: Negative.   All other systems reviewed and are negative.  Physical Exam Updated Vital Signs BP (!) 197/107 (BP Location: Left Arm)   Pulse 90   Temp 98.7 F (37.1 C) (Oral)   Resp 20   SpO2 99%  Physical  Exam Vitals and nursing note reviewed.  Constitutional:      General: She is not in acute distress.    Appearance: She is well-developed. She is not ill-appearing, toxic-appearing or diaphoretic.  HENT:     Head: Atraumatic.  Eyes:     Pupils: Pupils are equal, round, and reactive to light.  Cardiovascular:     Rate and Rhythm: Normal rate.     Pulses: Normal pulses.          Dorsalis pedis pulses are 2+ on the right side and 2+ on the left side.     Heart sounds: Normal heart sounds.  Pulmonary:     Effort: No respiratory distress.  Abdominal:     General: There is no distension.  Musculoskeletal:        General: Normal range of motion.     Cervical back: Normal range of motion.     Comments: Nontender right knee, proximal, midshaft tib-fib.  Tenderness left lateral malleolus.  Nontender foot.  Overlying soft tissue swelling surrounding ankle.  No erythema, warmth  Skin:    General: Skin is warm and dry.     Capillary Refill: Capillary refill takes less than 2 seconds.     Comments: Erythema, warmth, lacerations, contusions or abrasions  Neurological:     General: No focal deficit present.  Mental Status: She is alert.     Cranial Nerves: Cranial nerves 2-12 are intact.     Sensory: Sensation is intact.     Gait: Gait abnormal (due to fx).     Comments: Intact sensation  Psychiatric:        Mood and Affect: Mood normal.    ED Results / Procedures / Treatments   Labs (all labs ordered are listed, but only abnormal results are displayed) Labs Reviewed - No data to display  EKG None  Radiology DG Ankle Complete Right Result Date: 12/09/2023 CLINICAL DATA:  Right ankle sprain. EXAM: RIGHT ANKLE - COMPLETE 3+ VIEW COMPARISON:  None Available. FINDINGS: Displaced oblique fracture of the distal fibula with 5 mm dorsal displacement of the distal fracture fragment. No dislocation. The ankle mortise is intact. The bones are mildly osteopenic. Soft tissue swelling of the  lateral ankle. No radiopaque foreign object or soft tissue gas. IMPRESSION: Displaced oblique fracture of the distal fibula. Electronically Signed   By: Angus Bark M.D.   On: 12/09/2023 14:29    Procedures .Splint Application  Date/Time: 12/09/2023 5:00 PM  Performed by: Dickson Founds, PA-C Authorized by: Dickson Founds, PA-C   Consent:    Consent obtained:  Verbal   Consent given by:  Patient   Risks, benefits, and alternatives were discussed: yes     Risks discussed:  Numbness, pain, swelling and discoloration   Alternatives discussed:  No treatment, delayed treatment, alternative treatment, observation and referral Universal protocol:    Procedure explained and questions answered to patient or proxy's satisfaction: yes     Relevant documents present and verified: yes     Test results available: yes     Imaging studies available: yes     Required blood products, implants, devices, and special equipment available: yes     Site/side marked: yes     Immediately prior to procedure a time out was called: yes     Patient identity confirmed:  Verbally with patient Pre-procedure details:    Distal neurologic exam:  Normal   Distal perfusion: distal pulses strong and brisk capillary refill   Procedure details:    Location:  Ankle   Ankle location:  R ankle   Strapping: no     Cast type:  Short leg   Splint type:  Ankle stirrup and short leg   Supplies:  Elastic bandage, fiberglass and cotton padding   Attestation: Splint applied and adjusted personally by me   Post-procedure details:    Distal neurologic exam:  Normal   Distal perfusion: distal pulses strong and brisk capillary refill     Procedure completion:  Tolerated well, no immediate complications   Post-procedure imaging: not applicable       Medications Ordered in ED Medications  HYDROcodone -acetaminophen  (NORCO/VICODIN) 5-325 MG per tablet 1 tablet (1 tablet Oral Given 12/09/23 1552)    ED Course/  Medical Decision Making/ A&P    Here for evaluation of right ankle pain after inversion injury at work.  No head injury.  Seen earlier at urgent care subsequently sent here for definitive splinting.  She is neurovascularly intact.  Her compartments are soft.  She has no bony tenderness to her foot, chest, proximal tib-fib, knee or femur.  I reviewed imaging from urgent care.  Does show a displaced oblique fracture of the distal fibula.  Plan on splinting.  Patient placed in a splint. NV intact after splint placement.  Will have her follow-up outpatient with  orthopedics.  RICE for symptomatic management.  Of note her blood pressure was elevated here.  She has no evidence of hypertensive urgency or emergency based off of history and exam.  Could certainly be due to pain.  Will have her follow-up with recheck for PCP from this.  The patient has been appropriately medically screened and/or stabilized in the ED. I have low suspicion for any other emergent medical condition which would require further screening, evaluation or treatment in the ED or require inpatient management.  Patient is hemodynamically stable and in no acute distress.  Patient able to ambulate in department prior to ED.  Evaluation does not show acute pathology that would require ongoing or additional emergent interventions while in the emergency department or further inpatient treatment.  I have discussed the diagnosis with the patient and answered all questions.  Pain is been managed while in the emergency department and patient has no further complaints prior to discharge.  Patient is comfortable with plan discussed in room and is stable for discharge at this time.  I have discussed strict return precautions for returning to the emergency department.  Patient was encouraged to follow-up with PCP/specialist refer to at discharge.                                Medical Decision Making Amount and/or Complexity of Data  Reviewed Independent Historian: friend External Data Reviewed: radiology and notes. Radiology: ordered and independent interpretation performed. Decision-making details documented in ED Course.  Risk OTC drugs. Prescription drug management. Decision regarding hospitalization. Diagnosis or treatment significantly limited by social determinants of health.          Final Clinical Impression(s) / ED Diagnoses Final diagnoses:  Other closed fracture of distal end of right fibula, initial encounter  Elevated blood pressure reading    Rx / DC Orders ED Discharge Orders          Ordered    oxyCODONE -acetaminophen  (PERCOCET/ROXICET) 5-325 MG tablet  Every 6 hours PRN        12/09/23 1656              Roslyn Else A, PA-C 12/09/23 1701    Arvilla Birmingham, MD 12/09/23 1735

## 2023-12-09 NOTE — ED Triage Notes (Addendum)
 Pt came in for a right broken ankle that happened this morning. Pt was sent to UC earlier today and had her ankle wrapped and braced. Pt stated she came to the ED for workers comp and pain meds.

## 2023-12-09 NOTE — Discharge Instructions (Addendum)
 It was a pleasure taking care of you here today.    We have placed in a splint.  Tried to keep your leg elevated.  Do not get the splint wet  I put you for a short course of pain medicine.  Take as prescribed.  Please use caution as this medication is an opiate and does have addictive potential.  It may make you sleepy.  Do not drive or operate heavy machinery while taking this medication or make life or death decisions.  Your blood pressure was elevated here today.  Which can be due to pain.  Make sure to follow-up with your primary care provider for recheck   I have placed the number to an orthopedic group in your discharge paperwork.  Please call them on Monday and let them know you were seen in the emergency department for a fracture and need to be seen  Make sure to follow-up, return for any worsening symptoms such as severe pain not controlled by pain medicine, fever, change in color to your toes, numbness or weakness to the extremity

## 2023-12-14 ENCOUNTER — Ambulatory Visit (INDEPENDENT_AMBULATORY_CARE_PROVIDER_SITE_OTHER): Payer: Worker's Compensation | Admitting: Orthopaedic Surgery

## 2023-12-14 DIAGNOSIS — S82891A Other fracture of right lower leg, initial encounter for closed fracture: Secondary | ICD-10-CM | POA: Diagnosis not present

## 2023-12-14 NOTE — Progress Notes (Signed)
 Chief Complaint: Right ankle pain     History of Present Illness:    Hayley Carr is a 53 y.o. female presents today after an ankle injury for which she slipped on May 16.  Since this time she is placed in an ankle splint and made nonweightbearing.  She is here today for the discussion.  She has been using a wheelchair and crutches although this is quite difficult to ambulate.  She remains out of work at this time.  She is otherwise healthy    PMH/PSH/Family History/Social History/Meds/Allergies:    Past Medical History:  Diagnosis Date   Former smoker    Marijuana abuse    Past Surgical History:  Procedure Laterality Date   FINGER GANGLION CYST EXCISION     FINGER SURGERY     INCISION AND DRAINAGE ABSCESS Right 02/06/2016   Procedure: INCISION AND DRAINAGE ABSCESS;  Surgeon: Brunilda Capra, MD;  Location: [facility] OR;  Service: Orthopedics;  Laterality: Right;   UTERINE FIBROID SURGERY     Social History   Socioeconomic History   Marital status: Single    Spouse name: Not on file   Number of children: Not on file   Years of education: Not on file   Highest education level: Not on file  Occupational History   Not on file  Tobacco Use   Smoking status: Former   Smokeless tobacco: Never  Substance and Sexual Activity   Alcohol use: No   Drug use: Yes    Types: Marijuana   Sexual activity: Not on file  Other Topics Concern   Not on file  Social History Narrative   Not on file   Social Drivers of Health   Financial Resource Strain: Not on file  Food Insecurity: Not on file  Transportation Needs: Not on file  Physical Activity: Not on file  Stress: Not on file  Social Connections: Not on file   Family History  Problem Relation Age of Onset   Colon cancer Father    Allergies  Allergen Reactions   Ibuprofen Nausea Only   Current Outpatient Medications  Medication Sig Dispense Refill   amoxicillin -clavulanate (AUGMENTIN ) 875-125 MG tablet Take 1 tablet by  mouth every 12 (twelve) hours. 14 tablet 0   bacitracin  ointment Apply 1 application. topically 2 (two) times daily. 14 g 0   ferrous gluconate  (FERGON) 324 MG tablet Take 1 tablet (324 mg total) by mouth 3 (three) times daily with meals. 90 tablet 3   oxyCODONE -acetaminophen  (PERCOCET/ROXICET) 5-325 MG tablet Take 1 tablet by mouth every 6 (six) hours as needed for severe pain (pain score 7-10). 15 tablet 0   No current facility-administered medications for this visit.   No results found.  Review of Systems:   A ROS was performed including pertinent positives and negatives as documented in the HPI.  Physical Exam :   Constitutional: NAD and appears stated age Neurological: Alert and oriented Psych: Appropriate affect and cooperative There were no vitals taken for this visit.   Comprehensive Musculoskeletal Exam:    Right ankle in a splint which is clean dry and intact.  There is swelling about the ankle.  Sensations intact in all toes   Imaging:   Xray (3 views right ankle): Weber B fibula fracture without evidence of syndesmotic disruption or widening     I personally reviewed and interpreted the radiographs.   Assessment and Plan:   53 y.o. female with a minimally displaced Weber B ankle fibular fracture.  At this time I did discuss that I would recommend getting out of her splint and into a cam boot.  We will plan to have her progress weightbearing.  She will engage with physical therapy.  I will plan to see her back in 4 weeks for reassessment   I personally saw and evaluated the patient, and participated in the management and treatment plan.  Wilhelmenia Harada, MD Attending Physician, Orthopedic Surgery  This document was dictated using Dragon voice recognition software. A reasonable attempt at proof reading has been made to minimize errors.

## 2023-12-14 NOTE — Addendum Note (Signed)
 Addended by: Albesa Huguenin on: 12/14/2023 05:28 PM   Modules accepted: Orders

## 2024-01-11 ENCOUNTER — Ambulatory Visit (HOSPITAL_BASED_OUTPATIENT_CLINIC_OR_DEPARTMENT_OTHER): Payer: Worker's Compensation | Admitting: Orthopaedic Surgery

## 2024-01-11 ENCOUNTER — Ambulatory Visit (HOSPITAL_BASED_OUTPATIENT_CLINIC_OR_DEPARTMENT_OTHER): Payer: Self-pay

## 2024-01-11 DIAGNOSIS — S82891A Other fracture of right lower leg, initial encounter for closed fracture: Secondary | ICD-10-CM

## 2024-01-11 NOTE — Progress Notes (Signed)
 Chief Complaint: Right ankle pain     History of Present Illness:   01/11/2024: Follows up today for her right ankle.  Hayley Carr is a 53 y.o. female presents today after an ankle injury for which she slipped on May 16.  Since this time she is placed in an ankle splint and made nonweightbearing.  She is here today for the discussion.  She has been using a wheelchair and crutches although this is quite difficult to ambulate.  She remains out of work at this time.  She is otherwise healthy    PMH/PSH/Family History/Social History/Meds/Allergies:    Past Medical History:  Diagnosis Date   Former smoker    Marijuana abuse    Past Surgical History:  Procedure Laterality Date   FINGER GANGLION CYST EXCISION     FINGER SURGERY     INCISION AND DRAINAGE ABSCESS Right 02/06/2016   Procedure: INCISION AND DRAINAGE ABSCESS;  Surgeon: Brunilda Capra, MD;  Location: [facility] OR;  Service: Orthopedics;  Laterality: Right;   UTERINE FIBROID SURGERY     Social History   Socioeconomic History   Marital status: Single    Spouse name: Not on file   Number of children: Not on file   Years of education: Not on file   Highest education level: Not on file  Occupational History   Not on file  Tobacco Use   Smoking status: Former   Smokeless tobacco: Never  Substance and Sexual Activity   Alcohol use: No   Drug use: Yes    Types: Marijuana   Sexual activity: Not on file  Other Topics Concern   Not on file  Social History Narrative   Not on file   Social Drivers of Health   Financial Resource Strain: Not on file  Food Insecurity: Not on file  Transportation Needs: Not on file  Physical Activity: Not on file  Stress: Not on file  Social Connections: Not on file   Family History  Problem Relation Age of Onset   Colon cancer Father    Allergies  Allergen Reactions   Ibuprofen Nausea Only   Current Outpatient Medications  Medication Sig Dispense Refill   amoxicillin -clavulanate  (AUGMENTIN ) 875-125 MG tablet Take 1 tablet by mouth every 12 (twelve) hours. 14 tablet 0   bacitracin  ointment Apply 1 application. topically 2 (two) times daily. 14 g 0   ferrous gluconate  (FERGON) 324 MG tablet Take 1 tablet (324 mg total) by mouth 3 (three) times daily with meals. 90 tablet 3   oxyCODONE -acetaminophen  (PERCOCET/ROXICET) 5-325 MG tablet Take 1 tablet by mouth every 6 (six) hours as needed for severe pain (pain score 7-10). 15 tablet 0   No current facility-administered medications for this visit.   No results found.  Review of Systems:   A ROS was performed including pertinent positives and negatives as documented in the HPI.  Physical Exam :   Constitutional: NAD and appears stated age Neurological: Alert and oriented Psych: Appropriate affect and cooperative There were no vitals taken for this visit.   Comprehensive Musculoskeletal Exam:    Right ankle in a splint which is clean dry and intact.  There is swelling about the ankle.  Sensations intact in all toes   Imaging:   Xray (3 views right ankle): Weber B fibula fracture without evidence of syndesmotic disruption or widening     I personally reviewed and interpreted the radiographs.   Assessment and Plan:   53 y.o. female with  a minimally displaced Weber B ankle fibular fracture.  X-rays today show significant healing.  She will wean her boot at this time as well as her crutches.  I will plan to see her back in 6 weeks for reassessment  -Return to clinic in 6 weeks for reassessment   I personally saw and evaluated the patient, and participated in the management and treatment plan.  Wilhelmenia Harada, MD Attending Physician, Orthopedic Surgery  This document was dictated using Dragon voice recognition software. A reasonable attempt at proof reading has been made to minimize errors.

## 2024-01-23 ENCOUNTER — Ambulatory Visit (HOSPITAL_BASED_OUTPATIENT_CLINIC_OR_DEPARTMENT_OTHER): Payer: Self-pay | Admitting: Physical Therapy

## 2024-01-23 ENCOUNTER — Encounter (HOSPITAL_BASED_OUTPATIENT_CLINIC_OR_DEPARTMENT_OTHER): Payer: Self-pay

## 2024-02-22 ENCOUNTER — Ambulatory Visit (HOSPITAL_BASED_OUTPATIENT_CLINIC_OR_DEPARTMENT_OTHER): Payer: Self-pay

## 2024-02-22 ENCOUNTER — Ambulatory Visit (HOSPITAL_BASED_OUTPATIENT_CLINIC_OR_DEPARTMENT_OTHER): Payer: Worker's Compensation | Admitting: Orthopaedic Surgery

## 2024-02-22 DIAGNOSIS — S82891A Other fracture of right lower leg, initial encounter for closed fracture: Secondary | ICD-10-CM

## 2024-02-22 NOTE — Progress Notes (Signed)
 Chief Complaint: Right ankle pain     History of Present Illness:   02/22/2024: Presents today for further discussion of the right ankle.  Overall she is continuing to improve.  She is having some mild swelling at this point.  Hayley Carr is a 54 y.o. female presents today after an ankle injury for which she slipped on May 16.  Since this time she is placed in an ankle splint and made nonweightbearing.  She is here today for the discussion.  She has been using a wheelchair and crutches although this is quite difficult to ambulate.  She remains out of work at this time.  She is otherwise healthy    PMH/PSH/Family History/Social History/Meds/Allergies:    Past Medical History:  Diagnosis Date   Former smoker    Marijuana abuse    Past Surgical History:  Procedure Laterality Date   FINGER GANGLION CYST EXCISION     FINGER SURGERY     INCISION AND DRAINAGE ABSCESS Right 02/06/2016   Procedure: INCISION AND DRAINAGE ABSCESS;  Surgeon: Franky Curia, MD;  Location: [facility] OR;  Service: Orthopedics;  Laterality: Right;   UTERINE FIBROID SURGERY     Social History   Socioeconomic History   Marital status: Single    Spouse name: Not on file   Number of children: Not on file   Years of education: Not on file   Highest education level: Not on file  Occupational History   Not on file  Tobacco Use   Smoking status: Former   Smokeless tobacco: Never  Substance and Sexual Activity   Alcohol use: No   Drug use: Yes    Types: Marijuana   Sexual activity: Not on file  Other Topics Concern   Not on file  Social History Narrative   Not on file   Social Drivers of Health   Financial Resource Strain: Not on file  Food Insecurity: Not on file  Transportation Needs: Not on file  Physical Activity: Not on file  Stress: Not on file  Social Connections: Not on file   Family History  Problem Relation Age of Onset   Colon cancer Father    Allergies  Allergen Reactions   Ibuprofen  Nausea Only   Current Outpatient Medications  Medication Sig Dispense Refill   amoxicillin -clavulanate (AUGMENTIN ) 875-125 MG tablet Take 1 tablet by mouth every 12 (twelve) hours. 14 tablet 0   bacitracin  ointment Apply 1 application. topically 2 (two) times daily. 14 g 0   ferrous gluconate  (FERGON) 324 MG tablet Take 1 tablet (324 mg total) by mouth 3 (three) times daily with meals. 90 tablet 3   oxyCODONE -acetaminophen  (PERCOCET/ROXICET) 5-325 MG tablet Take 1 tablet by mouth every 6 (six) hours as needed for severe pain (pain score 7-10). 15 tablet 0   No current facility-administered medications for this visit.   No results found.  Review of Systems:   A ROS was performed including pertinent positives and negatives as documented in the HPI.  Physical Exam :   Constitutional: NAD and appears stated age Neurological: Alert and oriented Psych: Appropriate affect and cooperative There were no vitals taken for this visit.   Comprehensive Musculoskeletal Exam:    Right ankle in a splint which is clean dry and intact.  There is swelling about the ankle.  Sensations intact in all toes   Imaging:   Xray (3 views right ankle): Weber B fibula fracture without evidence of syndesmotic disruption or widening  I personally reviewed and interpreted the radiographs.   Assessment and Plan:   53 y.o. female with a minimally displaced Weber B ankle fibular fracture.  X-rays today show significant healing.  Overall doing very well.  I will plan to see her back in 3 months for reassessment  -Return to clinic in 12 weeks for reassessment   I personally saw and evaluated the patient, and participated in the management and treatment plan.  Elspeth Parker, MD Attending Physician, Orthopedic Surgery  This document was dictated using Dragon voice recognition software. A reasonable attempt at proof reading has been made to minimize errors.

## 2024-02-24 ENCOUNTER — Telehealth: Payer: Self-pay

## 2024-02-24 NOTE — Telephone Encounter (Signed)
 Wants a return call to discuss her letter that was written today.  [phone]

## 2024-03-05 ENCOUNTER — Other Ambulatory Visit (HOSPITAL_BASED_OUTPATIENT_CLINIC_OR_DEPARTMENT_OTHER): Payer: Self-pay | Admitting: Orthopaedic Surgery

## 2024-03-05 DIAGNOSIS — S82891A Other fracture of right lower leg, initial encounter for closed fracture: Secondary | ICD-10-CM

## 2024-03-29 ENCOUNTER — Ambulatory Visit (INDEPENDENT_AMBULATORY_CARE_PROVIDER_SITE_OTHER): Payer: Worker's Compensation

## 2024-03-29 ENCOUNTER — Ambulatory Visit (INDEPENDENT_AMBULATORY_CARE_PROVIDER_SITE_OTHER): Payer: Worker's Compensation | Admitting: Orthopaedic Surgery

## 2024-03-29 DIAGNOSIS — S82891A Other fracture of right lower leg, initial encounter for closed fracture: Secondary | ICD-10-CM | POA: Diagnosis not present

## 2024-03-29 NOTE — Progress Notes (Signed)
 Chief Complaint: Right ankle pain     History of Present Illness:   03/29/2024: Presents today for further discussion of the right ankle.  She did complete a functional capacity evaluation which did indicate only occasional ability to lift 30 pounds.  Hayley Carr is a 53 y.o. female presents today after an ankle injury for which she slipped on May 16.  Since this time she is placed in an ankle splint and made nonweightbearing.  She is here today for the discussion.  She has been using a wheelchair and crutches although this is quite difficult to ambulate.  She remains out of work at this time.  She is otherwise healthy    PMH/PSH/Family History/Social History/Meds/Allergies:    Past Medical History:  Diagnosis Date   Former smoker    Marijuana abuse    Past Surgical History:  Procedure Laterality Date   FINGER GANGLION CYST EXCISION     FINGER SURGERY     INCISION AND DRAINAGE ABSCESS Right 02/06/2016   Procedure: INCISION AND DRAINAGE ABSCESS;  Surgeon: Franky Curia, MD;  Location: [facility] OR;  Service: Orthopedics;  Laterality: Right;   UTERINE FIBROID SURGERY     Social History   Socioeconomic History   Marital status: Single    Spouse name: Not on file   Number of children: Not on file   Years of education: Not on file   Highest education level: Not on file  Occupational History   Not on file  Tobacco Use   Smoking status: Former   Smokeless tobacco: Never  Substance and Sexual Activity   Alcohol use: No   Drug use: Yes    Types: Marijuana   Sexual activity: Not on file  Other Topics Concern   Not on file  Social History Narrative   Not on file   Social Drivers of Health   Financial Resource Strain: Not on file  Food Insecurity: Not on file  Transportation Needs: Not on file  Physical Activity: Not on file  Stress: Not on file  Social Connections: Not on file   Family History  Problem Relation Age of Onset   Colon cancer Father    Allergies   Allergen Reactions   Ibuprofen Nausea Only   Current Outpatient Medications  Medication Sig Dispense Refill   amoxicillin -clavulanate (AUGMENTIN ) 875-125 MG tablet Take 1 tablet by mouth every 12 (twelve) hours. 14 tablet 0   bacitracin  ointment Apply 1 application. topically 2 (two) times daily. 14 g 0   ferrous gluconate  (FERGON) 324 MG tablet Take 1 tablet (324 mg total) by mouth 3 (three) times daily with meals. 90 tablet 3   oxyCODONE -acetaminophen  (PERCOCET/ROXICET) 5-325 MG tablet Take 1 tablet by mouth every 6 (six) hours as needed for severe pain (pain score 7-10). 15 tablet 0   No current facility-administered medications for this visit.   No results found.  Review of Systems:   A ROS was performed including pertinent positives and negatives as documented in the HPI.  Physical Exam :   Constitutional: NAD and appears stated age Neurological: Alert and oriented Psych: Appropriate affect and cooperative There were no vitals taken for this visit.   Comprehensive Musculoskeletal Exam:    Right ankle in a splint which is clean dry and intact.  There is swelling about the ankle.  Sensations intact in all toes   Imaging:   Xray (3 views right ankle): Weber B fibula fracture without evidence of syndesmotic disruption or widening  I personally reviewed and interpreted the radiographs.   Assessment and Plan:   53 y.o. female with a minimally displaced Weber B ankle fibular fracture.  X-rays today show significant healing.  She is still having persistent joint pain.  Her functional capacity evaluation showed that she is only able to occasionally lift 15 pounds which her daily job duties may be more than this.  As result I do believe that she would require light duty accommodations given the functional capacity evaluation which are not available at her current position.  At this time she is still having pain and as result I would like to obtain an MRI of the right ankle and  see her back to discuss result  - Plan for MRI right ankle and follow-up discuss results   I personally saw and evaluated the patient, and participated in the management and treatment plan.  Elspeth Parker, MD Attending Physician, Orthopedic Surgery  This document was dictated using Dragon voice recognition software. A reasonable attempt at proof reading has been made to minimize errors.

## 2024-04-04 ENCOUNTER — Telehealth: Payer: Self-pay | Admitting: Orthopaedic Surgery

## 2024-04-04 NOTE — Telephone Encounter (Signed)
 Faxed 03/29/24 ov note & work note [employer]/ [employer] [phone]

## 2024-04-06 ENCOUNTER — Telehealth: Payer: Self-pay | Admitting: Orthopaedic Surgery

## 2024-04-06 NOTE — Telephone Encounter (Signed)
 Faxed order for MRI [employer] [phone]

## 2024-04-12 ENCOUNTER — Other Ambulatory Visit: Payer: Self-pay

## 2024-04-17 ENCOUNTER — Ambulatory Visit
Admission: RE | Admit: 2024-04-17 | Discharge: 2024-04-17 | Disposition: A | Discharge status: Home / Self Care | Payer: Self-pay | Source: Ambulatory Visit | Attending: Orthopaedic Surgery | Admitting: Orthopaedic Surgery

## 2024-04-17 DIAGNOSIS — S82891A Other fracture of right lower leg, initial encounter for closed fracture: Secondary | ICD-10-CM

## 2024-05-24 ENCOUNTER — Ambulatory Visit (HOSPITAL_BASED_OUTPATIENT_CLINIC_OR_DEPARTMENT_OTHER): Payer: Self-pay | Admitting: Orthopaedic Surgery

## 2024-05-24 ENCOUNTER — Other Ambulatory Visit (HOSPITAL_BASED_OUTPATIENT_CLINIC_OR_DEPARTMENT_OTHER): Payer: Self-pay

## 2024-05-24 ENCOUNTER — Ambulatory Visit (INDEPENDENT_AMBULATORY_CARE_PROVIDER_SITE_OTHER): Payer: Worker's Compensation | Admitting: Orthopaedic Surgery

## 2024-05-24 DIAGNOSIS — S82891A Other fracture of right lower leg, initial encounter for closed fracture: Secondary | ICD-10-CM

## 2024-05-24 MED ORDER — OXYCODONE HCL 5 MG PO TABS
5.0000 mg | ORAL_TABLET | ORAL | 0 refills | Status: AC | PRN
  Filled 2024-05-24: qty 20, 4d supply, fill #0

## 2024-05-24 MED ORDER — ASPIRIN 325 MG PO TBEC
325.0000 mg | DELAYED_RELEASE_TABLET | Freq: Every day | ORAL | 0 refills | Status: AC
  Filled 2024-05-24: qty 14, 14d supply, fill #0

## 2024-05-24 MED ORDER — ACETAMINOPHEN 500 MG PO TABS
500.0000 mg | ORAL_TABLET | Freq: Three times a day (TID) | ORAL | 0 refills | Status: AC
  Filled 2024-05-24: qty 30, 10d supply, fill #0

## 2024-05-24 NOTE — Progress Notes (Signed)
 Chief Complaint: Right ankle pain     History of Present Illness:   05/24/2024: Presents today for MRI follow-up.  She is still having significant pain about the lateral malleolus.Hayley Carr is a 53 y.o. female presents today after an ankle injury for which she slipped on May 16.  Since this time she is placed in an ankle splint and made nonweightbearing.  She is here today for the discussion.  She has been using a wheelchair and crutches although this is quite difficult to ambulate.  She remains out of work at this time.  She is otherwise healthy    PMH/PSH/Family History/Social History/Meds/Allergies:    Past Medical History:  Diagnosis Date   Former smoker    Marijuana abuse    Past Surgical History:  Procedure Laterality Date   FINGER GANGLION CYST EXCISION     FINGER SURGERY     INCISION AND DRAINAGE ABSCESS Right 02/06/2016   Procedure: INCISION AND DRAINAGE ABSCESS;  Surgeon: Franky Curia, MD;  Location: [facility] OR;  Service: Orthopedics;  Laterality: Right;   UTERINE FIBROID SURGERY     Social History   Socioeconomic History   Marital status: Single    Spouse name: Not on file   Number of children: Not on file   Years of education: Not on file   Highest education level: Not on file  Occupational History   Not on file  Tobacco Use   Smoking status: Former   Smokeless tobacco: Never  Substance and Sexual Activity   Alcohol use: No   Drug use: Yes    Types: Marijuana   Sexual activity: Not on file  Other Topics Concern   Not on file  Social History Narrative   Not on file   Social Drivers of Health   Financial Resource Strain: Not on file  Food Insecurity: Not on file  Transportation Needs: Not on file  Physical Activity: Not on file  Stress: Not on file  Social Connections: Not on file   Family History  Problem Relation Age of Onset   Colon cancer Father    Allergies  Allergen Reactions   Ibuprofen Nausea Only   Current Outpatient  Medications  Medication Sig Dispense Refill   acetaminophen  (TYLENOL ) 500 MG tablet Take 1 tablet (500 mg total) by mouth every 8 (eight) hours for 10 days. 30 tablet 0   aspirin EC 325 MG tablet Take 1 tablet (325 mg total) by mouth daily. 14 tablet 0   oxyCODONE  (ROXICODONE ) 5 MG immediate release tablet Take 1 tablet (5 mg total) by mouth every 4 (four) hours as needed for severe pain (pain score 7-10) or breakthrough pain. 20 tablet 0   amoxicillin -clavulanate (AUGMENTIN ) 875-125 MG tablet Take 1 tablet by mouth every 12 (twelve) hours. 14 tablet 0   bacitracin  ointment Apply 1 application. topically 2 (two) times daily. 14 g 0   ferrous gluconate  (FERGON) 324 MG tablet Take 1 tablet (324 mg total) by mouth 3 (three) times daily with meals. 90 tablet 3   oxyCODONE -acetaminophen  (PERCOCET/ROXICET) 5-325 MG tablet Take 1 tablet by mouth every 6 (six) hours as needed for severe pain (pain score 7-10). 15 tablet 0   No current facility-administered medications for this visit.   No results found.  Review of Systems:   A ROS was performed including pertinent positives and negatives as documented in the HPI.  Physical Exam :   Constitutional: NAD and appears stated age Neurological: Alert and oriented  Psych: Appropriate affect and cooperative There were no vitals taken for this visit.   Comprehensive Musculoskeletal Exam:   Right ankle tenderness about the distal fibula with pain with external rotation of the foot.  There is some swelling about the ankle.  Remainder of distal neurosensory exam is intact.  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   Assessment and Plan:   53 y.o. female with a minimally displaced Weber B ankle fibular fracture.  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  Given this unfortunately I do believe that surgical intervention would be needed to promote healing of the fracture.  I did discuss ultimately that what would recommend an approach to the fibula in order to create a bleeding surface to take down the fibular nonunion as well as fibular nailing to promote stability with distal syndesmotic fixation across the joint.  I did discuss risks and limitations.  I did discuss all the associated recovery timeframe.  After discussion she would like to proceed  - Plan for right ankle fibular nailing with syndesmotic fixation   After a lengthy discussion of treatment options, including risks, benefits, alternatives, complications of surgical and nonsurgical conservative options, the patient elected surgical repair.   The patient  is aware of the material risks  and complications including, but not limited to injury to adjacent structures, neurovascular injury, infection, numbness, bleeding, implant failure, thermal burns, stiffness, persistent pain, failure to heal, disease transmission from allograft, need for further surgery, dislocation, anesthetic risks, blood clots, risks of death,and others. The probabilities of surgical success and failure discussed with patient given their particular co-morbidities.The time and nature of expected rehabilitation and recovery was discussed.The patient's questions were all answered preoperatively.  No barriers to understanding were noted. I explained the natural history of the disease process and Rx rationale.  I explained to the patient what I considered to be reasonable expectations given their personal situation.  The final treatment plan was arrived at through a shared patient decision making process model.   I personally saw and evaluated the patient, and participated  in the management and treatment plan.  Elspeth Parker, MD Attending Physician, Orthopedic Surgery  This document was dictated using Dragon voice recognition software. A reasonable attempt at proof reading has been made to minimize errors.

## 2024-05-28 ENCOUNTER — Encounter: Payer: Self-pay | Admitting: Radiology
